# Patient Record
Sex: Female | Born: 1995 | Race: Black or African American | Hispanic: No | Marital: Single | Smoking: Never smoker
Health system: Southern US, Community
[De-identification: ages and names within clinical notes are randomized; demographics above are authoritative.]

## PROBLEM LIST (undated history)

## (undated) DIAGNOSIS — B9689 Other specified bacterial agents as the cause of diseases classified elsewhere: Secondary | ICD-10-CM

## (undated) DIAGNOSIS — N760 Acute vaginitis: Secondary | ICD-10-CM

## (undated) HISTORY — DX: Other specified bacterial agents as the cause of diseases classified elsewhere: N76.0

## (undated) HISTORY — PX: TONSILLECTOMY: SUR1361

## (undated) HISTORY — DX: Other specified bacterial agents as the cause of diseases classified elsewhere: B96.89

## (undated) HISTORY — PX: TOOTH EXTRACTION: SUR596

---
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
## 2017-08-29 HISTORY — PX: THERAPEUTIC ABORTION: SHX798

## 2018-02-19 ENCOUNTER — Encounter: Payer: Self-pay | Admitting: Obstetrics and Gynecology

## 2018-02-20 ENCOUNTER — Ambulatory Visit (INDEPENDENT_AMBULATORY_CARE_PROVIDER_SITE_OTHER): Payer: BLUE CROSS/BLUE SHIELD | Admitting: Obstetrics and Gynecology

## 2018-02-20 ENCOUNTER — Encounter: Payer: Self-pay | Admitting: Obstetrics and Gynecology

## 2018-02-20 VITALS — BP 112/70 | HR 85 | Ht 60.0 in | Wt 134.0 lb

## 2018-02-20 DIAGNOSIS — Z3041 Encounter for surveillance of contraceptive pills: Secondary | ICD-10-CM

## 2018-02-20 DIAGNOSIS — Z113 Encounter for screening for infections with a predominantly sexual mode of transmission: Secondary | ICD-10-CM | POA: Diagnosis not present

## 2018-02-20 DIAGNOSIS — Z01419 Encounter for gynecological examination (general) (routine) without abnormal findings: Secondary | ICD-10-CM

## 2018-02-20 MED ORDER — NORGESTIM-ETH ESTRAD TRIPHASIC 0.18/0.215/0.25 MG-25 MCG PO TABS: 1.0000 | ORAL_TABLET | Freq: Every day | ORAL | 3 refills | Status: DC

## 2018-02-20 NOTE — Progress Notes (Addendum)
PCP:  Patient, No Pcp Per   Chief Complaint  Patient presents with  . Gynecologic Exam     HPI:      [name] is a 22 y.o. No obstetric history on file. who LMP was Patient's last menstrual period was 02/08/2018 (approximate)., presents today for her NP annual examination.  Her menses are regular every 28-30 days, lasting 4 days.  Dysmenorrhea mild, occurring premenstrually. She does not have intermenstrual bleeding.  Sex activity: single partner, contraception - OCP (estrogen/progesterone) and condoms sometimes.  Last Pap: N/A Hx of STDs: none  There is no FH of breast cancer. There is no FH of ovarian cancer. The patient does do self-breast exams.  Tobacco use: The patient denies current or previous tobacco use. Alcohol use: social drinker No drug use.  Exercise: moderately active  She does not get adequate calcium and Vitamin D in her diet.  Gardasil declined   Past Medical History:  Diagnosis Date  . BV (bacterial vaginosis)     Past Surgical History:  Procedure Laterality Date  . TONSILLECTOMY    . TOOTH EXTRACTION      History reviewed. No pertinent family history.  Social History   Socioeconomic History  . Marital status: Single    Spouse name: Not on file  . Number of children: Not on file  . Years of education: Not on file  . Highest education level: Not on file  Occupational History  . Not on file  Social Needs  . Financial resource strain: Not on file  . Food insecurity:    Worry: Not on file    Inability: Not on file  . Transportation needs:    Medical: Not on file    Non-medical: Not on file  Tobacco Use  . Smoking status: Never Smoker  . Smokeless tobacco: Never Used  Substance and Sexual Activity  . Alcohol use: Not Currently  . Drug use: Never  . Sexual activity: Yes    Birth control/protection: Pill  Lifestyle  . Physical activity:    Days per week: Not on file    Minutes per session: Not on file  . Stress: Not on  file  Relationships  . Social connections:    Talks on phone: Not on file    Gets together: Not on file    Attends religious service: Not on file    Active member of club or organization: Not on file    Attends meetings of clubs or organizations: Not on file    Relationship status: Not on file  . Intimate partner violence:    Fear of current or ex partner: Not on file    Emotionally abused: Not on file    Physically abused: Not on file    Forced sexual activity: Not on file  Other Topics Concern  . Not on file  Social History Narrative  . Not on file    Outpatient Medications Prior to Visit  Medication Sig Dispense Refill  . Norgestimate-Ethinyl Estradiol Triphasic (TRI-LO-MARZIA) 0.18/0.215/0.25 MG-25 MCG tab TAKE 1 TABLET BY MOUTH EVERY DAY     No facility-administered medications prior to visit.       ROS:  Review of Systems  Constitutional: Negative for fatigue, fever and unexpected weight change.  Respiratory: Negative for cough, shortness of breath and wheezing.   Cardiovascular: Negative for chest pain, palpitations and leg swelling.  Gastrointestinal: Negative for blood in stool, constipation, diarrhea, nausea and vomiting.  Endocrine: Negative for cold intolerance,  heat intolerance and polyuria.  Genitourinary: Negative for dyspareunia, dysuria, flank pain, frequency, genital sores, hematuria, menstrual problem, pelvic pain, urgency, vaginal bleeding, vaginal discharge and vaginal pain.  Musculoskeletal: Negative for back pain, joint swelling and myalgias.  Skin: Negative for rash.  Neurological: Negative for dizziness, syncope, light-headedness, numbness and headaches.  Hematological: Negative for adenopathy.  Psychiatric/Behavioral: Negative for agitation, confusion, sleep disturbance and suicidal ideas. The patient is not nervous/anxious.   BREAST: No symptoms   Objective: BP 112/70   Pulse 85   Ht 5' (1.524 m)   Wt 134 lb (60.8 kg)   LMP 02/08/2018  (Approximate)   BMI 26.17 kg/m    Physical Exam  Constitutional: She is oriented to person, place, and time. She appears well-developed and well-nourished.  Genitourinary: Vagina normal and uterus normal. There is no rash or tenderness on the right labia. There is no rash or tenderness on the left labia. No erythema or tenderness in the vagina. No vaginal discharge found. Right adnexum does not display mass and does not display tenderness. Left adnexum does not display mass and does not display tenderness. Cervix does not exhibit motion tenderness or polyp. Uterus is not enlarged or tender.  Neck: Normal range of motion. No thyromegaly present.  Cardiovascular: Normal rate, regular rhythm and normal heart sounds.  No murmur heard. Pulmonary/Chest: Effort normal and breath sounds normal. Right breast exhibits no mass, no nipple discharge, no skin change and no tenderness. Left breast exhibits no mass, no nipple discharge, no skin change and no tenderness.  Abdominal: Soft. There is no tenderness. There is no guarding.  Musculoskeletal: Normal range of motion.  Neurological: She is alert and oriented to person, place, and time. No cranial nerve deficit.  Psychiatric: She has a normal mood and affect. Her behavior is normal.  Vitals reviewed.   Assessment/Plan: Encounter for annual routine gynecological examination  Screening for STD (sexually transmitted disease) - Plan: Chlamydia/Gonococcus/Trichomonas, NAA  Encounter for surveillance of contraceptive pills - OCP RF - Plan: Norgestimate-Ethinyl Estradiol Triphasic (TRI-LO-MARZIA) 0.18/0.215/0.25 MG-25 MCG tab  Meds ordered this encounter  Medications  . Norgestimate-Ethinyl Estradiol Triphasic (TRI-LO-MARZIA) 0.18/0.215/0.25 MG-25 MCG tab    Sig: Take 1 tablet by mouth daily.    Dispense:  3 Package    Refill:  3    Order Specific Question:   Supervising Provider    Answer:   Nadara MustardHARRIS, ROBERT P [[id number]]             GYN counsel breast  self exam, STD prevention, adequate intake of calcium and vitamin D, diet and exercise     F/U  Return in about 1 year (around 02/21/2019).  Alicia B. Copland, PA-C 02/20/2018 10:33 AM

## 2018-02-20 NOTE — Patient Instructions (Signed)
I value your feedback and entrusting us with your care. If you get a [facility] patient survey, I would appreciate you taking the time to let us know about your experience today. Thank you! 

## 2018-02-22 LAB — CHLAMYDIA/GONOCOCCUS/TRICHOMONAS, NAA
Chlamydia by NAA: NEGATIVE
Gonococcus by NAA: NEGATIVE
Trich vag by NAA: NEGATIVE

## 2019-03-14 ENCOUNTER — Other Ambulatory Visit: Payer: Self-pay | Admitting: *Deleted

## 2019-03-14 DIAGNOSIS — Z20822 Contact with and (suspected) exposure to covid-19: Secondary | ICD-10-CM

## 2019-03-19 ENCOUNTER — Other Ambulatory Visit: Payer: Self-pay | Admitting: Obstetrics and Gynecology

## 2019-03-19 DIAGNOSIS — Z3041 Encounter for surveillance of contraceptive pills: Secondary | ICD-10-CM

## 2019-03-21 LAB — NOVEL CORONAVIRUS, NAA: SARS-CoV-2, NAA: NOT DETECTED

## 2019-03-23 ENCOUNTER — Other Ambulatory Visit: Payer: Self-pay | Admitting: Obstetrics and Gynecology

## 2019-03-23 DIAGNOSIS — Z3041 Encounter for surveillance of contraceptive pills: Secondary | ICD-10-CM

## 2019-04-29 ENCOUNTER — Ambulatory Visit: Payer: BLUE CROSS/BLUE SHIELD | Admitting: Obstetrics and Gynecology

## 2019-05-02 ENCOUNTER — Other Ambulatory Visit: Payer: Self-pay

## 2019-05-02 ENCOUNTER — Encounter: Payer: Self-pay | Admitting: Obstetrics and Gynecology

## 2019-05-02 ENCOUNTER — Ambulatory Visit (INDEPENDENT_AMBULATORY_CARE_PROVIDER_SITE_OTHER): Payer: BLUE CROSS/BLUE SHIELD | Admitting: Obstetrics and Gynecology

## 2019-05-02 ENCOUNTER — Inpatient Hospital Stay (HOSPITAL_COMMUNITY): Admit: 2019-05-02 | Payer: BLUE CROSS/BLUE SHIELD

## 2019-05-02 VITALS — BP 110/80 | Ht 61.0 in | Wt 129.0 lb

## 2019-05-02 DIAGNOSIS — Z01419 Encounter for gynecological examination (general) (routine) without abnormal findings: Secondary | ICD-10-CM | POA: Diagnosis not present

## 2019-05-02 DIAGNOSIS — Z124 Encounter for screening for malignant neoplasm of cervix: Secondary | ICD-10-CM

## 2019-05-02 DIAGNOSIS — Z3041 Encounter for surveillance of contraceptive pills: Secondary | ICD-10-CM

## 2019-05-02 DIAGNOSIS — Z113 Encounter for screening for infections with a predominantly sexual mode of transmission: Secondary | ICD-10-CM

## 2019-05-02 MED ORDER — NORGESTIM-ETH ESTRAD TRIPHASIC 0.18/0.215/0.25 MG-25 MCG PO TABS: 1.0000 | ORAL_TABLET | Freq: Every day | ORAL | 3 refills | Status: DC

## 2019-05-02 NOTE — Patient Instructions (Signed)
I value your feedback and entrusting us with your care. If you get a [facility] patient survey, I would appreciate you taking the time to let us know about your experience today. Thank you! 

## 2019-05-02 NOTE — Progress Notes (Signed)
PCP:  Patient, No Pcp Per   Chief Complaint  Patient presents with  . Gynecologic Exam     HPI:      [name] is a 23 y.o. No obstetric history on file. who LMP was Patient's last menstrual period was 04/18/2019 (exact date)., presents today for her annual examination.  Her menses are regular every 28-30 days, lasting 4-5 days.  Dysmenorrhea none. She does not have intermenstrual bleeding.  Sex activity: single partner, contraception - OCP (estrogen/progesterone) and condoms sometimes. S/p EAB last yr after lost insurance and had to stop pills.   Last Pap: N/A in past; due this yr Hx of STDs: none  There is no FH of breast cancer. There is no FH of ovarian cancer. The patient does do self-breast exams.  Tobacco use: The patient denies current or previous tobacco use. Alcohol use: social drinker No drug use.  Exercise: moderately active  She does get adequate calcium and Vitamin D in her diet.  Gardasil declined again this yr   Past Medical History:  Diagnosis Date  . BV (bacterial vaginosis)     Past Surgical History:  Procedure Laterality Date  . THERAPEUTIC ABORTION  2019  . TONSILLECTOMY    . TOOTH EXTRACTION      Family History  Problem Relation Age of Onset  . Diabetes Mother     Social History   Socioeconomic History  . Marital status: Single    Spouse name: Not on file  . Number of children: Not on file  . Years of education: Not on file  . Highest education level: Not on file  Occupational History  . Not on file  Social Needs  . Financial resource strain: Not on file  . Food insecurity    Worry: Not on file    Inability: Not on file  . Transportation needs    Medical: Not on file    Non-medical: Not on file  Tobacco Use  . Smoking status: Never Smoker  . Smokeless tobacco: Never Used  Substance and Sexual Activity  . Alcohol use: Not Currently  . Drug use: Never  . Sexual activity: Yes    Birth control/protection: Pill   Lifestyle  . Physical activity    Days per week: Not on file    Minutes per session: Not on file  . Stress: Not on file  Relationships  . Social Musicianconnections    Talks on phone: Not on file    Gets together: Not on file    Attends religious service: Not on file    Active member of club or organization: Not on file    Attends meetings of clubs or organizations: Not on file    Relationship status: Not on file  . Intimate partner violence    Fear of current or ex partner: Not on file    Emotionally abused: Not on file    Physically abused: Not on file    Forced sexual activity: Not on file  Other Topics Concern  . Not on file  Social History Narrative  . Not on file    Outpatient Medications Prior to Visit  Medication Sig Dispense Refill  . TRI-LO-MARZIA 0.18/0.215/0.25 MG-25 MCG tab TAKE 1 TABLET BY MOUTH EVERY DAY 84 tablet 0   No facility-administered medications prior to visit.       ROS:  Review of Systems  Constitutional: Negative for fatigue, fever and unexpected weight change.  Respiratory: Negative for cough, shortness of breath  and wheezing.   Cardiovascular: Negative for chest pain, palpitations and leg swelling.  Gastrointestinal: Negative for blood in stool, constipation, diarrhea, nausea and vomiting.  Endocrine: Negative for cold intolerance, heat intolerance and polyuria.  Genitourinary: Negative for dyspareunia, dysuria, flank pain, frequency, genital sores, hematuria, menstrual problem, pelvic pain, urgency, vaginal bleeding, vaginal discharge and vaginal pain.  Musculoskeletal: Negative for back pain, joint swelling and myalgias.  Skin: Negative for rash.  Neurological: Negative for dizziness, syncope, light-headedness, numbness and headaches.  Hematological: Negative for adenopathy.  Psychiatric/Behavioral: Negative for agitation, confusion, sleep disturbance and suicidal ideas. The patient is not nervous/anxious.   BREAST: No symptoms   Objective:  BP 110/80   Ht 5\' 1"  (1.549 m)   Wt 129 lb (58.5 kg)   LMP 04/18/2019 (Exact Date)   BMI 24.37 kg/m    Physical Exam Constitutional:      Appearance: She is well-developed.  Genitourinary:     Vulva, vagina, uterus, right adnexa and left adnexa normal.     No vulval lesion or tenderness noted.     No vaginal discharge, erythema or tenderness.     No cervical motion tenderness or polyp.     Uterus is not enlarged or tender.     No right or left adnexal mass present.     Right adnexa not tender.     Left adnexa not tender.  Neck:     Musculoskeletal: Normal range of motion.     Thyroid: No thyromegaly.  Cardiovascular:     Rate and Rhythm: Normal rate and regular rhythm.     Heart sounds: Normal heart sounds. No murmur.  Pulmonary:     Effort: Pulmonary effort is normal.     Breath sounds: Normal breath sounds.  Chest:     Breasts:        Right: No mass, nipple discharge, skin change or tenderness.        Left: No mass, nipple discharge, skin change or tenderness.  Abdominal:     Palpations: Abdomen is soft.     Tenderness: There is no abdominal tenderness. There is no guarding.  Musculoskeletal: Normal range of motion.  Neurological:     General: No focal deficit present.     Mental Status: She is alert and oriented to person, place, and time.     Cranial Nerves: No cranial nerve deficit.  Skin:    General: Skin is warm and dry.  Psychiatric:        Mood and Affect: Mood normal.        Behavior: Behavior normal.        Thought Content: Thought content normal.        Judgment: Judgment normal.  Vitals signs reviewed.     Assessment/Plan: Encounter for annual routine gynecological examination  Cervical cancer screening - Plan: Cytology - PAP  Screening for STD (sexually transmitted disease) - Plan: Cytology - PAP  Encounter for surveillance of contraceptive pills - OCP RF - Plan: Norgestimate-Ethinyl Estradiol Triphasic (TRI-LO-MARZIA) 0.18/0.215/0.25 MG-25  MCG tab  Meds ordered this encounter  Medications  . Norgestimate-Ethinyl Estradiol Triphasic (TRI-LO-MARZIA) 0.18/0.215/0.25 MG-25 MCG tab    Sig: Take 1 tablet by mouth daily.    Dispense:  84 tablet    Refill:  3    Order Specific Question:   Supervising Provider    Answer:   Nadara Mustard [[id number]]             GYN counsel breast self exam, STD prevention,  adequate intake of calcium and vitamin D, diet and exercise     F/U  Return in about 1 year (around 05/01/2020).  Alicia B. Copland, PA-C 05/02/2019 11:13 AM

## 2019-05-07 LAB — CYTOLOGY - PAP
Chlamydia: NEGATIVE
Diagnosis: NEGATIVE
Neisseria Gonorrhea: NEGATIVE

## 2019-06-03 ENCOUNTER — Other Ambulatory Visit: Payer: Self-pay

## 2019-06-03 DIAGNOSIS — Z20822 Contact with and (suspected) exposure to covid-19: Secondary | ICD-10-CM

## 2019-06-05 LAB — NOVEL CORONAVIRUS, NAA: SARS-CoV-2, NAA: NOT DETECTED

## 2019-12-05 ENCOUNTER — Telehealth: Payer: Self-pay

## 2019-12-05 ENCOUNTER — Encounter: Payer: Self-pay | Admitting: Obstetrics and Gynecology

## 2019-12-05 NOTE — Telephone Encounter (Signed)
Stephanie w/[employer] calling in regards to PA for pt. [phone] opt 3 [id number]#[phone]

## 2019-12-05 NOTE — Telephone Encounter (Signed)
Pharmacy sent fax w/alternatives. Tri Lo Marzia not on formulary. Plan wants to try 3 formulary alternatives w/failure or intolerance prior to approval.

## 2019-12-05 NOTE — Telephone Encounter (Signed)
Patient called stating that she has COVID-19  She want to know when can she get the vaccine. Per CDC patient was told that she needs to wait until isolation is completed and her symptoms are gone.  It treated with convalescent plasm or monoclonial antibodies she must wait 90 days. She verbalized understanding of all information and has no other questions.

## 2019-12-09 ENCOUNTER — Other Ambulatory Visit: Payer: Self-pay | Admitting: Obstetrics and Gynecology

## 2019-12-09 MED ORDER — LEVONORGESTREL-ETHINYL ESTRAD 0.1-20 MG-MCG PO TABS: 1.0000 | ORAL_TABLET | Freq: Every day | ORAL | 1 refills | Status: DC

## 2019-12-09 NOTE — Telephone Encounter (Signed)
Rx aviane eRxd. On pt's formulary. Pls notify pt that Tri Lo Marzia not covered and has to do other OCPs first. Shouldn't have any problem. Start when next pill pack due. Can expect BTB the first pack or 2 since changing products, no lapse in [facility]. F/u prn.

## 2019-12-09 NOTE — Progress Notes (Signed)
OCP change to aviane due to ins formulary. This is covered alternative per pharmacy fax.

## 2019-12-09 NOTE — Telephone Encounter (Signed)
Pt aware.

## 2019-12-26 ENCOUNTER — Other Ambulatory Visit: Payer: Self-pay

## 2019-12-26 ENCOUNTER — Ambulatory Visit: Payer: 59 | Admitting: Nurse Practitioner

## 2019-12-26 ENCOUNTER — Encounter: Payer: Self-pay | Admitting: Nurse Practitioner

## 2019-12-26 VITALS — BP 98/58 | HR 90 | Temp 97.4°F | Ht 61.0 in | Wt 131.0 lb

## 2019-12-26 DIAGNOSIS — E049 Nontoxic goiter, unspecified: Secondary | ICD-10-CM

## 2019-12-26 DIAGNOSIS — Z0001 Encounter for general adult medical examination with abnormal findings: Secondary | ICD-10-CM

## 2019-12-26 DIAGNOSIS — Z0000 Encounter for general adult medical examination without abnormal findings: Secondary | ICD-10-CM

## 2019-12-26 DIAGNOSIS — F419 Anxiety disorder, unspecified: Secondary | ICD-10-CM

## 2019-12-26 HISTORY — DX: Encounter for general adult medical examination without abnormal findings: Z00.00

## 2019-12-26 LAB — LIPID PANEL
Cholesterol: 185 mg/dL (ref 0–200)
HDL: 62.4 mg/dL (ref >39.00)
LDL Cholesterol: 111 mg/dL — ABNORMAL HIGH (ref 0–99)
NonHDL: 123.05
Total CHOL/HDL Ratio: 3
Triglycerides: 61 mg/dL (ref 0.0–149.0)
VLDL: 12.2 mg/dL (ref 0.0–40.0)

## 2019-12-26 LAB — COMPREHENSIVE METABOLIC PANEL
ALT: 10 U/L (ref 0–35)
AST: 12 U/L (ref 0–37)
Albumin: 4.2 g/dL (ref 3.5–5.2)
Alkaline Phosphatase: 54 U/L (ref 39–117)
BUN: 7 mg/dL (ref 6–23)
CO2: 30 mEq/L (ref 19–32)
Calcium: 9.3 mg/dL (ref 8.4–10.5)
Chloride: 104 mEq/L (ref 96–112)
Creatinine, Ser: 0.75 mg/dL (ref 0.40–1.20)
GFR: 117.14 mL/min (ref >60.00)
Glucose, Bld: 99 mg/dL (ref 70–99)
Potassium: 4 mEq/L (ref 3.5–5.1)
Sodium: 138 mEq/L (ref 135–145)
Total Bilirubin: 0.2 mg/dL (ref 0.2–1.2)
Total Protein: 7.3 g/dL (ref 6.0–8.3)

## 2019-12-26 LAB — CBC WITH DIFFERENTIAL/PLATELET
Basophils Absolute: 0 10*3/uL (ref 0.0–0.1)
Basophils Relative: 1.2 % (ref 0.0–3.0)
Eosinophils Absolute: 0 10*3/uL (ref 0.0–0.7)
Eosinophils Relative: 0.7 % (ref 0.0–5.0)
HCT: 37.9 % (ref 36.0–46.0)
Hemoglobin: 12.5 g/dL (ref 12.0–15.0)
Lymphocytes Relative: 47.7 % — ABNORMAL HIGH (ref 12.0–46.0)
Lymphs Abs: 1 10*3/uL (ref 0.7–4.0)
MCHC: 32.9 g/dL (ref 30.0–36.0)
MCV: 83.9 fl (ref 78.0–100.0)
Monocytes Absolute: 0.4 10*3/uL (ref 0.1–1.0)
Monocytes Relative: 17.8 % — ABNORMAL HIGH (ref 3.0–12.0)
Neutro Abs: 0.7 10*3/uL — ABNORMAL LOW (ref 1.4–7.7)
Neutrophils Relative %: 32.6 % — ABNORMAL LOW (ref 43.0–77.0)
Platelets: 201 10*3/uL (ref 150.0–400.0)
RBC: 4.52 Mil/uL (ref 3.87–5.11)
RDW: 12.3 % (ref 11.5–15.5)
WBC: 2.1 10*3/uL — ABNORMAL LOW (ref 4.0–10.5)

## 2019-12-26 LAB — T3, FREE: T3, Free: 3.1 pg/mL (ref 2.3–4.2)

## 2019-12-26 LAB — T4, FREE: Free T4: 0.85 ng/dL (ref 0.60–1.60)

## 2019-12-26 LAB — VITAMIN D 25 HYDROXY (VIT D DEFICIENCY, FRACTURES): VITD: 23.84 ng/mL — ABNORMAL LOW (ref 30.00–100.00)

## 2019-12-26 NOTE — Patient Instructions (Addendum)
Wellness exam:   1. Diet and Exercise: I discussed the importance of a healthy lifestyle including appropriate food choices and regular exercise. I recommended a diet that is moderate in fiber with fresh fruits and vegetables and low in saturated fats and simple carbohydrates. I recommended getting exercise daily and vigorous exercise 3 times per week to include a total of at least 150 minutes per week.   2. Vaccines/Immunizations: The patient is up to date on age-appropriate screening exams and vaccines. 3.  Needs Tdap in the future- getting Covid vaccines now.   4. Chlamydia screen per GYN due 2021  5. Thyroid enlargement will check labs  6. Referral for anxiety to a counselor.     Preventive Care 4-32 Years Old, Female Preventive care refers to lifestyle choices and visits with your health care provider that can promote health and wellness. At this stage in your life, you may start seeing a primary care physician instead of a pediatrician. Your health care is now your responsibility. Preventive care for young adults includes:  A yearly physical exam. This is also called an annual wellness visit.  Regular dental and eye exams.  Immunizations.  Screening for certain conditions.  Healthy lifestyle choices, such as diet and exercise. What can I expect for my preventive care visit? Physical exam Your health care provider may check:  Height and weight. These may be used to calculate body mass index (BMI), which is a measurement that tells if you are at a healthy weight.  Heart rate and blood pressure.  Body temperature. Counseling Your health care provider may ask you questions about:  Past medical problems and family medical history.  Alcohol, tobacco, and drug use.  Home and relationship well-being.  Access to firearms.  Emotional well-being.  Diet, exercise, and sleep habits.  Sexual activity and sexual health.  Method of birth control.  Menstrual  cycle.  Pregnancy history. What immunizations do I need?  Influenza (flu) vaccine  This is recommended every year. Tetanus, diphtheria, and pertussis (Tdap) vaccine  You may need a Td booster every 10 years. Varicella (chickenpox) vaccine  You may need this vaccine if you have not already been vaccinated. Human papillomavirus (HPV) vaccine  If recommended by your health care provider, you may need three doses over 6 months. Measles, mumps, and rubella (MMR) vaccine  You may need at least one dose of MMR. You may also need a second dose. Meningococcal conjugate (MenACWY) vaccine  One dose is recommended if you are 31-21 years old and a Market researcher living in a residence hall, or if you have one of several medical conditions. You may also need additional booster doses. Pneumococcal conjugate (PCV13) vaccine  You may need this if you have certain conditions and were not previously vaccinated. Pneumococcal polysaccharide (PPSV23) vaccine  You may need one or two doses if you smoke cigarettes or if you have certain conditions. Hepatitis A vaccine  You may need this if you have certain conditions or if you travel or work in places where you may be exposed to hepatitis A. Hepatitis B vaccine  You may need this if you have certain conditions or if you travel or work in places where you may be exposed to hepatitis B. Haemophilus influenzae type b (Hib) vaccine  You may need this if you have certain risk factors. You may receive vaccines as individual doses or as more than one vaccine together in one shot (combination vaccines). Talk with your health care provider about  the risks and benefits of combination vaccines. What tests do I need? Blood tests  Lipid and cholesterol levels. These may be checked every 5 years starting at age 44.  Hepatitis C test.  Hepatitis B test. Screening  Pelvic exam and Pap test. This may be done every 3 years starting at age  13.  Sexually transmitted disease (STD) testing, if you are at risk.  BRCA-related cancer screening. This may be done if you have a family history of breast, ovarian, tubal, or peritoneal cancers. Other tests  Tuberculosis skin test.  Vision and hearing tests.  Skin exam.  Breast exam. Follow these instructions at home: Eating and drinking   Eat a diet that includes fresh fruits and vegetables, whole grains, lean protein, and low-fat dairy products.  Drink enough fluid to keep your urine pale yellow.  Do not drink alcohol if: ? Your health care provider tells you not to drink. ? You are pregnant, may be pregnant, or are planning to become pregnant. ? You are under the legal drinking age. In the [place] the legal drinking age is 29.  If you drink alcohol: ? Limit how much you have to 0-1 drink a day. ? Be aware of how much alcohol is in your drink. In the [place] one drink equals one 12 oz bottle of beer (355 mL), one 5 oz glass of wine (148 mL), or one 1 oz glass of hard liquor (44 mL). Lifestyle  Take daily care of your teeth and gums.  Stay active. Exercise at least 30 minutes 5 or more days of the week.  Do not use any products that contain nicotine or tobacco, such as cigarettes, e-cigarettes, and chewing tobacco. If you need help quitting, ask your health care provider.  Do not use drugs.  If you are sexually active, practice safe sex. Use a condom or other form of birth control (contraception) in order to prevent pregnancy and STIs (sexually transmitted infections). If you plan to become pregnant, see your health care provider for a pre-conception visit.  Find healthy ways to cope with stress, such as: ? Meditation, yoga, or listening to music. ? Journaling. ? Talking to a trusted person. ? Spending time with friends and family. Safety  Always wear your seat belt while driving or riding in a vehicle.  Do not drive if you have been drinking alcohol. Do not ride  with someone who has been drinking.  Do not drive when you are tired or distracted. Do not text while driving.  Wear a helmet and other protective equipment during sports activities.  If you have firearms in your house, make sure you follow all gun safety procedures.  Seek help if you have been bullied, physically abused, or sexually abused.  Use the Internet responsibly to avoid dangers such as online bullying and online sex predators. What's next?  Go to your health care provider once a year for a well check visit.  Ask your health care provider how often you should have your eyes and teeth checked.  Stay up to date on all vaccines. This information is not intended to replace advice given to you by your health care provider. Make sure you discuss any questions you have with your health care provider. Document Revised: 08/09/2018 Document Reviewed: 08/09/2018 Elsevier Patient Education  [place], Adult After being diagnosed with an anxiety disorder, you may be relieved to know why you have felt or behaved a certain way. You may also feel  overwhelmed about the treatment ahead and what it will mean for your life. With care and support, you can manage this condition and recover from it. How to manage lifestyle changes Managing stress and anxiety  Stress is your body's reaction to life changes and events, both good and bad. Most stress will last just a few hours, but stress can be ongoing and can lead to more than just stress. Although stress can play a major role in anxiety, it is not the same as anxiety. Stress is usually caused by something external, such as a deadline, test, or competition. Stress normally passes after the triggering event has ended.  Anxiety is caused by something internal, such as imagining a terrible outcome or worrying that something will go wrong that will devastate you. Anxiety often does not go away even after the triggering event is  over, and it can become long-term (chronic) worry. It is important to understand the differences between stress and anxiety and to manage your stress effectively so that it does not lead to an anxious response. Talk with your health care provider or a counselor to learn more about reducing anxiety and stress. He or she may suggest tension reduction techniques, such as:  Music therapy. This can include creating or listening to music that you enjoy and that inspires you.  Mindfulness-based meditation. This involves being aware of your normal breaths while not trying to control your breathing. It can be done while sitting or walking.  Centering prayer. This involves focusing on a word, phrase, or sacred image that means something to you and brings you peace.  Deep breathing. To do this, expand your stomach and inhale slowly through your nose. Hold your breath for 3-5 seconds. Then exhale slowly, letting your stomach muscles relax.  Self-talk. This involves identifying thought patterns that lead to anxiety reactions and changing those patterns.  Muscle relaxation. This involves tensing muscles and then relaxing them. Choose a tension reduction technique that suits your lifestyle and personality. These techniques take time and practice. Set aside 5-15 minutes a day to do them. Therapists can offer counseling and training in these techniques. The training to help with anxiety may be covered by some insurance plans. Other things you can do to manage stress and anxiety include:  Keeping a stress/anxiety diary. This can help you learn what triggers your reaction and then learn ways to manage your response.  Thinking about how you react to certain situations. You may not be able to control everything, but you can control your response.  Making time for activities that help you relax and not feeling guilty about spending your time in this way.  Visual imagery and yoga can help you stay calm and  relax.  Medicines Medicines can help ease symptoms. Medicines for anxiety include:  Anti-anxiety drugs.  Antidepressants. Medicines are often used as a primary treatment for anxiety disorder. Medicines will be prescribed by a health care provider. When used together, medicines, psychotherapy, and tension reduction techniques may be the most effective treatment. Relationships Relationships can play a big part in helping you recover. Try to spend more time connecting with trusted friends and family members. Consider going to couples counseling, taking family education classes, or going to family therapy. Therapy can help you and others better understand your condition. How to recognize changes in your anxiety Everyone responds differently to treatment for anxiety. Recovery from anxiety happens when symptoms decrease and stop interfering with your daily activities at home or work. This may  mean that you will start to:  Have better concentration and focus. Worry will interfere less in your daily thinking.  Sleep better.  Be less irritable.  Have more energy.  Have improved memory. It is important to recognize when your condition is getting worse. Contact your health care provider if your symptoms interfere with home or work and you feel like your condition is not improving. Follow these instructions at home: Activity  Exercise. Most adults should do the following: ? Exercise for at least 150 minutes each week. The exercise should increase your heart rate and make you sweat (moderate-intensity exercise). ? Strengthening exercises at least twice a week.  Get the right amount and quality of sleep. Most adults need 7-9 hours of sleep each night. Lifestyle   Eat a healthy diet that includes plenty of vegetables, fruits, whole grains, low-fat dairy products, and lean protein. Do not eat a lot of foods that are high in solid fats, added sugars, or salt.  Make choices that simplify your  life.  Do not use any products that contain nicotine or tobacco, such as cigarettes, e-cigarettes, and chewing tobacco. If you need help quitting, ask your health care provider.  Avoid caffeine, alcohol, and certain over-the-counter cold medicines. These may make you feel worse. Ask your pharmacist which medicines to avoid. General instructions  Take over-the-counter and prescription medicines only as told by your health care provider.  Keep all follow-up visits as told by your health care provider. This is important. Where to find support You can get help and support from these sources:  Self-help groups.  Online and [employer].  A trusted spiritual leader.  Couples counseling.  Family education classes.  Family therapy. Where to find more information You may find that joining a support group helps you deal with your anxiety. The following sources can help you locate counselors or support groups near you:  [place]: www.mentalhealthamer[place].net  Anxiety and Depression Association of [place] (ADAA): https://www.clark.net/  National Alliance on Mental Illness (NAMI): www.nami.org Contact a health care provider if you:  Have a hard time staying focused or finishing daily tasks.  Spend many hours a day feeling worried about everyday life.  Become exhausted by worry.  Start to have headaches, feel tense, or have nausea.  Urinate more than normal.  Have diarrhea. Get help right away if you have:  A racing heart and shortness of breath.  Thoughts of hurting yourself or others. If you ever feel like you may hurt yourself or others, or have thoughts about taking your own life, get help right away. You can go to your nearest emergency department or call:  Your local emergency services (911 in the [place]  A suicide crisis helpline, such as the [facility] at 972 305 2911. This is open 24 hours a day. Summary  Taking steps to  learn and use tension reduction techniques can help calm you and help prevent triggering an anxiety reaction.  When used together, medicines, psychotherapy, and tension reduction techniques may be the most effective treatment.  Family, friends, and partners can play a big part in helping you recover from an anxiety disorder. This information is not intended to replace advice given to you by your health care provider. Make sure you discuss any questions you have with your health care provider. Document Revised: 01/15/2019 Document Reviewed: 01/15/2019 Elsevier Patient Education  [place].

## 2019-12-26 NOTE — Progress Notes (Signed)
New Patient Office Visit  Subjective:  Patient ID: Connie Stein, female    DOB: 22-Apr-1996  Age: 24 y.o. MRN: [id number]  CC:  Chief Complaint  Patient presents with  . Establish Care    HPI Connie Stein presents for comes in to establish care. She was told x2 BP was high when she sprained her foot and again when she got the Covid test.  She is followed for birth control at GYN. LMP -current 12/25/19.  Anxiety: Started the end of 2019 before Covid. Then, she had to leave college and come home where her stress escalated over 2020. She states that she worries about everything. Some nights she cannot fall asleep for hours. Other nights, she goes to bed at 2100 and wakes up at 0440. She is very busy with a job at [employer], school and a side job babysitting. 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 She says she does not want any medication for her anxiety.  She did speak to the therapist in the past and did not find that very helpful.  Her scores today on PHQ-4 and she does not feel depressed or sad.  No history of depression, self-harm. Her anxiety score is higher.  GAD7- 10.   Past Medical History:  Diagnosis Date  . BV (bacterial vaginosis)   . Healthy adult on routine physical examination 12/26/2019    Past Surgical History:  Procedure Laterality Date  . THERAPEUTIC ABORTION  2019  . TONSILLECTOMY    . TOOTH EXTRACTION      Family History  Problem Relation Age of Onset  . Diabetes Mother     Social History   Socioeconomic History  . Marital status: Single    Spouse name: Not on file  . Number of children: Not on file  . Years of education: Not on file  . Highest education level: Not on file  Occupational History  . Not on file  Tobacco Use  . Smoking status: Never Smoker  . Smokeless tobacco: Never Used  Substance and Sexual Activity  . Alcohol use: Yes    Comment:  On occasion  . Drug use: Never  . Sexual activity: Yes    Birth control/protection: Pill  Other Topics Concern  . Not on file  Social History Narrative  . Not on file   Social Determinants of Health   Financial Resource Strain:   . Difficulty of Paying Living Expenses:   Food Insecurity:   . Worried About Charity fundraiser in the Last Year:   . Arboriculturist in the Last Year:   Transportation Needs:   . Film/video editor (Medical):   Marland Kitchen Lack of Transportation (Non-Medical):   Physical Activity:   . Days of Exercise per Week:   . Minutes of Exercise per Session:   Stress:   . Feeling of Stress :   Social Connections:   . Frequency of Communication with Friends and Family:   . Frequency of Social Gatherings with Friends and Family:   . Attends Religious Services:   . Active Member of Clubs or Organizations:   . Attends Archivist Meetings:   Marland Kitchen Marital Status:   Intimate Partner Violence:   . Fear of Current or Ex-Partner:   . Emotionally Abused:   Marland Kitchen Physically Abused:   . Sexually Abused:      Review of  Systems  Constitutional: Negative for chills and fever.  HENT: Negative for congestion and sore throat.   Eyes: Negative.   Respiratory: Negative for cough and shortness of breath.   Cardiovascular: Negative for chest pain.  Gastrointestinal: Negative for abdominal pain and constipation.  Endocrine: Negative.   Genitourinary: Negative for dysuria and frequency.  Musculoskeletal: Negative for arthralgias.  Skin: Negative.  Negative for color change.  Allergic/Immunologic: Negative.   Neurological: Negative.   Hematological: Negative.   Psychiatric/Behavioral:       Positive anxiety- see HPI    Objective:   Today's Vitals: BP (!) 98/58   Pulse 90   Temp (!) 97.4 F (36.3 C) (Temporal)   Ht 5\' 1"  (1.549 m)   Wt 131 lb (59.4 kg)   LMP 12/24/2019 (Exact Date)   SpO2 97%   BMI 24.75 kg/m   Physical Exam Constitutional:      Appearance:  Normal appearance. She is normal weight.  HENT:     Head: Normocephalic and atraumatic.     Right Ear: Tympanic membrane normal.     Left Ear: Tympanic membrane normal.  Eyes:     Conjunctiva/sclera: Conjunctivae normal.     Pupils: Pupils are equal, round, and reactive to light.  Neck:     Comments: Very enlarged swelling bilateral at base of neck to consider goiter. Non-tender. Patient has not noticed this swelling.   Cardiovascular:     Rate and Rhythm: Normal rate and regular rhythm.     Pulses: Normal pulses.     Heart sounds: Normal heart sounds.  Pulmonary:     Effort: Pulmonary effort is normal.     Breath sounds: Normal breath sounds.  Abdominal:     General: Abdomen is flat.     Palpations: Abdomen is soft.     Tenderness: There is no abdominal tenderness.  Musculoskeletal:        General: Normal range of motion.     Cervical back: Normal range of motion and neck supple.  Skin:    General: Skin is warm and dry.  Neurological:     General: No focal deficit present.     Mental Status: She is alert and oriented to person, place, and time.  Psychiatric:        Mood and Affect: Mood normal.        Behavior: Behavior normal.        Thought Content: Thought content normal.        Judgment: Judgment normal.     Comments: Positive anxiety-see HPI.       Assessment & Plan:   Problem List Items Addressed This Visit      Other   Healthy adult on routine physical examination - Primary   Relevant Orders   CBC with Differential/Platelet (Completed)   Comprehensive metabolic panel (Completed)   Lipid panel (Completed)   VITAMIN D 25 Hydroxy (Vit-D Deficiency, Fractures) (Completed)    Other Visit Diagnoses    Anxiety       Relevant Orders   Ambulatory referral to Psychology   Thyroid enlargement       Relevant Orders   T4, free (Completed)   T3, free (Completed)      Outpatient Encounter Medications as of 12/26/2019  Medication Sig  . levonorgestrel-ethinyl  estradiol (AVIANE) 0.1-20 MG-MCG tablet Take 1 tablet by mouth daily.   No facility-administered encounter medications on file as of 12/26/2019.   1. Diet and Exercise: I discussed the importance of a healthy  lifestyle including appropriate food choices and regular exercise. I recommended a diet that is moderate in fiber with fresh fruits and vegetables and low in saturated fats and simple carbohydrates. I recommended getting exercise daily and vigorous exercise 3 times per week to include a total of at least 150 minutes per week.   2. Vaccines/Immunizations: The patient is up to date on age-appropriate screening exams and vaccines. 3.  Needs Tdap in the future- getting Covid vaccines now.   4. Chlamydia screen per GYN due 2021  5. Thyroid enlargement will check labs. She will need [place] of neck- will await thyroid results and decide on referral to ENT or ENDOCRINOLOGY  6. Referral for anxiety to a counselor.    Follow-up: Return in about 2 months (around 02/25/2020).   This visit occurred during the SARS-CoV-2 public health emergency.  Safety protocols were in place, including screening questions prior to the visit, additional usage of staff PPE, and extensive cleaning of exam room while observing appropriate contact time as indicated for disinfecting solutions.   Amedeo Kinsman, NP

## 2019-12-27 ENCOUNTER — Telehealth: Payer: Self-pay | Admitting: Nurse Practitioner

## 2019-12-27 DIAGNOSIS — E049 Nontoxic goiter, unspecified: Secondary | ICD-10-CM

## 2019-12-27 DIAGNOSIS — D709 Neutropenia, unspecified: Secondary | ICD-10-CM

## 2019-12-27 NOTE — Telephone Encounter (Signed)
Can you add on a TSH from yest? <48 hours.

## 2019-12-27 NOTE — Telephone Encounter (Signed)
I spoke to [facility] about the CBC and she is in agreement to see Hematology. I will place an urgent referral.   I have requested an add on for TSH.

## 2019-12-27 NOTE — Telephone Encounter (Signed)
I called her to give lab results and discuss plan of care. I had to leave a MOM to call us back. Please get me to the phone when she returns the call.

## 2019-12-30 NOTE — Addendum Note (Signed)
Addended by: Warden Fillers on: 12/30/2019 10:29 AM   Modules accepted: Orders

## 2019-12-30 NOTE — Telephone Encounter (Signed)
TSH can not be added. Will need to be redrawn. Please send add on request to both of [place] in the lab to ensure these request are added in a timely manner.

## 2019-12-30 NOTE — Telephone Encounter (Signed)
Add on sheet faxed 

## 2019-12-31 NOTE — Telephone Encounter (Signed)
Patient scheduled for TSH lab on 01/14/2020 at 10:45am

## 2020-01-01 ENCOUNTER — Telehealth: Payer: Self-pay | Admitting: Nurse Practitioner

## 2020-01-01 DIAGNOSIS — E049 Nontoxic goiter, unspecified: Secondary | ICD-10-CM

## 2020-01-01 DIAGNOSIS — D709 Neutropenia, unspecified: Secondary | ICD-10-CM

## 2020-01-01 NOTE — Telephone Encounter (Signed)
Connie Stein: Please call her and advise that she must see Hematology irregardless of what the TSH shows. She has an abnormality in her CBC that needs further investigation. I would like her to be seen ASAP.   If she agrees, please forward to Rasheedah to re-submit the referral.

## 2020-01-01 NOTE — Telephone Encounter (Signed)
Message from 01/01/2020 from Hematology: Patient states wants to go to PCP first before make appt. MR

## 2020-01-02 NOTE — Telephone Encounter (Signed)
Can you please re-submit referral to hematology. Patient agreed to see them.

## 2020-01-02 NOTE — Telephone Encounter (Signed)
I called her Thurs and left HIPA compliant message.   Please call her and advise:   I would like [facility] to get a thyroid US now. We do not need to wait for the TSH. I will need it because of her neck swelling.  I have added blood work- to her 5/18/ visit after collaboration with Dr. Lorin Picket.   Please keep  f/up with me arranged.

## 2020-01-03 ENCOUNTER — Telehealth: Payer: Self-pay | Admitting: Nurse Practitioner

## 2020-01-03 NOTE — Telephone Encounter (Signed)
Left vm for pt to call ofc to sch US thyroid.

## 2020-01-03 NOTE — Telephone Encounter (Signed)
Rasheedah called patient to schedule [place]; waiting on patient to call back.

## 2020-01-09 NOTE — Telephone Encounter (Signed)
I spoke with pt she is scheduled to have [place]

## 2020-01-13 ENCOUNTER — Ambulatory Visit
Admission: RE | Admit: 2020-01-13 | Discharge: 2020-01-13 | Disposition: A | Discharge status: Home / Self Care | Payer: 59 | Source: Ambulatory Visit | Attending: Nurse Practitioner | Admitting: Nurse Practitioner

## 2020-01-13 ENCOUNTER — Other Ambulatory Visit: Payer: Self-pay

## 2020-01-13 DIAGNOSIS — E049 Nontoxic goiter, unspecified: Secondary | ICD-10-CM | POA: Insufficient documentation

## 2020-01-14 ENCOUNTER — Other Ambulatory Visit (INDEPENDENT_AMBULATORY_CARE_PROVIDER_SITE_OTHER): Payer: 59

## 2020-01-14 ENCOUNTER — Other Ambulatory Visit: Payer: Self-pay

## 2020-01-14 ENCOUNTER — Telehealth: Payer: Self-pay | Admitting: Nurse Practitioner

## 2020-01-14 DIAGNOSIS — R221 Localized swelling, mass and lump, neck: Secondary | ICD-10-CM

## 2020-01-14 DIAGNOSIS — D709 Neutropenia, unspecified: Secondary | ICD-10-CM | POA: Diagnosis not present

## 2020-01-14 DIAGNOSIS — E049 Nontoxic goiter, unspecified: Secondary | ICD-10-CM | POA: Diagnosis not present

## 2020-01-14 LAB — CBC WITH DIFFERENTIAL/PLATELET
Basophils Absolute: 0 10*3/uL (ref 0.0–0.1)
Basophils Relative: 0.8 % (ref 0.0–3.0)
Eosinophils Absolute: 0 10*3/uL (ref 0.0–0.7)
Eosinophils Relative: 1.1 % (ref 0.0–5.0)
HCT: 37.5 % (ref 36.0–46.0)
Hemoglobin: 12.4 g/dL (ref 12.0–15.0)
Lymphocytes Relative: 50.2 % — ABNORMAL HIGH (ref 12.0–46.0)
Lymphs Abs: 1.4 10*3/uL (ref 0.7–4.0)
MCHC: 33.1 g/dL (ref 30.0–36.0)
MCV: 84.1 fl (ref 78.0–100.0)
Monocytes Absolute: 0.3 10*3/uL (ref 0.1–1.0)
Monocytes Relative: 9.8 % (ref 3.0–12.0)
Neutro Abs: 1.1 10*3/uL — ABNORMAL LOW (ref 1.4–7.7)
Neutrophils Relative %: 38.1 % — ABNORMAL LOW (ref 43.0–77.0)
Platelets: 232 10*3/uL (ref 150.0–400.0)
RBC: 4.46 Mil/uL (ref 3.87–5.11)
RDW: 12.6 % (ref 11.5–15.5)
WBC: 2.8 10*3/uL — ABNORMAL LOW (ref 4.0–10.5)

## 2020-01-14 LAB — TSH: TSH: 1.1 u[IU]/mL (ref 0.35–4.50)

## 2020-01-14 LAB — B12 AND FOLATE PANEL
Folate: 11.7 ng/mL (ref >5.9)
Vitamin B-12: 210 pg/mL — ABNORMAL LOW (ref 211–911)

## 2020-01-14 NOTE — Addendum Note (Signed)
Addended by: Warden Fillers on: 01/14/2020 11:27 AM   Modules accepted: Orders

## 2020-01-14 NOTE — Telephone Encounter (Addendum)
Connie Stein came in for blood work today. Her thyroid labs and thyroid US returned normal. She has neck swelling. Dr. Lorin Picket examined her neck and notes symmetric swelling in the neck and the left is a little more full. No discrete nodule. The patient has noticed a slight swelling, but it is not bothersome to her. It is not tender, no sore throat or any difficulty swallowing.She is not obese.   PLAN:  1. ENT referral to advise regarding need for further imaging for neck swelling. Her only free date to see ENT is next Tues and she lives in [place] and hopes to get an appt in [place] or closer to home. If she can't get in next Tues- next date will be after June 15.   2. Her CBC remains abnormal and she needs to see Hematology. We have already placed a referral  For Dr. Smith Robert for neutropenia. The patient can only come after June 15. I resubmitted a new referral in for her [place] office- closer to [facility].

## 2020-01-15 ENCOUNTER — Other Ambulatory Visit: Payer: 59

## 2020-01-15 LAB — ACUTE HEP PANEL AND HEP B SURFACE AB
HEPATITIS C ANTIBODY REFILL$(REFL): NONREACTIVE
Hep A IgM: NONREACTIVE
Hep B C IgM: NONREACTIVE
Hepatitis B Surface Ag: NONREACTIVE
SIGNAL TO CUT-OFF: 0.01 (ref <1.00)

## 2020-01-15 LAB — REFLEX TIQ

## 2020-01-15 LAB — HIV ANTIBODY (ROUTINE TESTING W REFLEX): HIV 1&2 Ab, 4th Generation: NONREACTIVE

## 2020-01-15 NOTE — Telephone Encounter (Signed)
Referral to ENT was placed they will call pt to schedule once they review referral.

## 2020-01-15 NOTE — Telephone Encounter (Signed)
Patient aware of below message

## 2020-01-21 ENCOUNTER — Telehealth: Payer: Self-pay | Admitting: Nurse Practitioner

## 2020-01-21 NOTE — Telephone Encounter (Signed)
This was noted on 05/05 Patient states wants to go to PCP first before make appt. MR  Rejection Reason - Patient Declined" [facility] said 1 day ago

## 2020-01-21 NOTE — Telephone Encounter (Signed)
I saw he and she is in agreement to see HEMATOL now - but cannot go until 3rd week in June as on vacation,

## 2020-01-22 ENCOUNTER — Telehealth: Payer: Self-pay | Admitting: Nurse Practitioner

## 2020-01-22 ENCOUNTER — Ambulatory Visit (INDEPENDENT_AMBULATORY_CARE_PROVIDER_SITE_OTHER): Payer: 59 | Admitting: Psychology

## 2020-01-22 DIAGNOSIS — F411 Generalized anxiety disorder: Secondary | ICD-10-CM | POA: Diagnosis not present

## 2020-01-22 NOTE — Telephone Encounter (Signed)
Rejection Reason - Patient did not respond - pt was going to talk to PCP and call back but pt have not responded back" [facility] said about 2 hours ago

## 2020-01-24 NOTE — Telephone Encounter (Signed)
Ok. Thanks!

## 2020-02-02 ENCOUNTER — Telehealth: Payer: Self-pay | Admitting: Nurse Practitioner

## 2020-02-02 NOTE — Telephone Encounter (Signed)
Connie Stein has postponed her referrals until after graduation and her vacation -back June 3,2021.   I asked [name] to try to get those arranged for ENT-neck swelling and normal thyroid and to Hematology for neutropenia.  Plan:  Can you keep an eye out for this to get completed? Thank you.

## 2020-02-12 ENCOUNTER — Ambulatory Visit: Payer: 59 | Admitting: Psychology

## 2020-02-17 NOTE — Telephone Encounter (Signed)
Have you been able to reach out to patient about her referrals?

## 2020-02-20 NOTE — Telephone Encounter (Signed)
As of 01/16/2020 msg from ENT Kennon Rounds spoke w/pt today and pt stated she did not need our services. PM

## 2020-02-26 ENCOUNTER — Ambulatory Visit: Payer: 59 | Admitting: Nurse Practitioner

## 2020-03-06 ENCOUNTER — Encounter: Payer: Self-pay | Admitting: Nurse Practitioner

## 2020-03-06 ENCOUNTER — Ambulatory Visit (INDEPENDENT_AMBULATORY_CARE_PROVIDER_SITE_OTHER): Payer: 59 | Admitting: Nurse Practitioner

## 2020-03-06 ENCOUNTER — Other Ambulatory Visit: Payer: Self-pay

## 2020-03-06 VITALS — BP 100/58 | HR 80 | Temp 98.4°F | Ht 61.0 in | Wt 131.0 lb

## 2020-03-06 DIAGNOSIS — D709 Neutropenia, unspecified: Secondary | ICD-10-CM | POA: Insufficient documentation

## 2020-03-06 DIAGNOSIS — R7989 Other specified abnormal findings of blood chemistry: Secondary | ICD-10-CM | POA: Diagnosis not present

## 2020-03-06 DIAGNOSIS — E538 Deficiency of other specified B group vitamins: Secondary | ICD-10-CM

## 2020-03-06 DIAGNOSIS — R221 Localized swelling, mass and lump, neck: Secondary | ICD-10-CM | POA: Insufficient documentation

## 2020-03-06 HISTORY — DX: Neutropenia, unspecified: D70.9

## 2020-03-06 HISTORY — DX: Localized swelling, mass and lump, neck: R22.1

## 2020-03-06 NOTE — Progress Notes (Signed)
Established Patient Office Visit  Subjective:  Patient ID: Connie Stein, female    DOB: Dec 27, 1995  Age: 24 y.o. MRN: [id number]  CC:  Chief Complaint  Patient presents with  . Follow-up    HPI Connie GeorgeSakeah T Whisenant presents for follow-up of neck swelling with negative thyroid ultrasound and laboratory studies with referral to ENT incomplete, chronic stable neutropenia with referral to hematology incomplete.  She presents today feeling well without any complaints.  She was last seen in the office 12/26/2019 and has graduated from college, and returned from vacation.  She reports her anxiety is much improved.   Labs returning negative for HIV, hepatitis, thyroid. CBC still shows the lower than normal WBC  (2.1 and 2.8) and neutrophils( 32.6 and 38). B12 is 210- low- please take B12 oral 1000 mcg pill daily.  Low Vit D 23.8  She has declined hematology appointment when they called to make an appt.  Today, she reports she did not understand why she needs to be seen although we had discussion about neutropenia several times.  Patient is in agreement to see Hematology.  She has swelling at the base of the neck with unremarkable thyroid ultrasound and thyroid labs:  Normal free T3, free T4 and TSH.  She was referred to ENT and when they called to make her an appt, she told them that that she did not need their services.  We discussed again  the reason for ENT evaluation is for opinion on neck swelling that is not thyroid related.  Patient is agreement to see ENT.   Lab Results  Component Value Date   TSH 1.10 01/14/2020   CLINICAL DATA:  Thyroid enlargement  EXAM: THYROID ULTRASOUND  TECHNIQUE: Ultrasound examination of the thyroid gland and adjacent soft tissues was performed.  COMPARISON:  None.  FINDINGS: Parenchymal Echotexture: Normal  Isthmus: 2 mm  Right lobe: 4.3 x 1.4 x 1.7 cm  Left lobe: 4.3 x 1.2 x 1.3  cm  _________________________________________________________  Estimated total number of nodules >/= 1 cm: 0  Number of spongiform nodules >/=  2 cm not described below (TR1): 0  Number of mixed cystic and solid nodules >/= 1.5 cm not described below (TR2): 0  _________________________________________________________  No discrete nodules are seen within the thyroid gland.  IMPRESSION: Normal thyroid ultrasound for age  The above is in keeping with the ACR TI-RADS recommendations - J Am Coll Radiol 2017;14:587-595.   Electronically Signed   By: Judie PetitM.  Shick M.D.   On: 01/13/2020 14:40  Past Medical History:  Diagnosis Date  . BV (bacterial vaginosis)   . Healthy adult on routine physical examination 12/26/2019  . Neck swelling 03/06/2020  . Neutropenia (HCC) 03/06/2020    Past Surgical History:  Procedure Laterality Date  . THERAPEUTIC ABORTION  2019  . TONSILLECTOMY    . TOOTH EXTRACTION      Family History  Problem Relation Age of Onset  . Diabetes Mother     Social History   Socioeconomic History  . Marital status: Single    Spouse name: Not on file  . Number of children: Not on file  . Years of education: Not on file  . Highest education level: Not on file  Occupational History  . Not on file  Tobacco Use  . Smoking status: Never Smoker  . Smokeless tobacco: Never Used  Vaping Use  . Vaping Use: Never used  Substance and Sexual Activity  . Alcohol use: Yes  Comment: On occasion  . Drug use: Never  . Sexual activity: Yes    Birth control/protection: Pill  Other Topics Concern  . Not on file  Social History Narrative  . Not on file   Social Determinants of Health   Financial Resource Strain:   . Difficulty of Paying Living Expenses:   Food Insecurity:   . Worried About Programme researcher, broadcasting/film/video in the Last Year:   . Barista in the Last Year:   Transportation Needs:   . Freight forwarder (Medical):   Marland Kitchen Lack of Transportation  (Non-Medical):   Physical Activity:   . Days of Exercise per Week:   . Minutes of Exercise per Session:   Stress:   . Feeling of Stress :   Social Connections:   . Frequency of Communication with Friends and Family:   . Frequency of Social Gatherings with Friends and Family:   . Attends Religious Services:   . Active Member of Clubs or Organizations:   . Attends Banker Meetings:   Marland Kitchen Marital Status:   Intimate Partner Violence:   . Fear of Current or Ex-Partner:   . Emotionally Abused:   Marland Kitchen Physically Abused:   . Sexually Abused:     Outpatient Medications Prior to Visit  Medication Sig Dispense Refill  . levonorgestrel-ethinyl estradiol (AVIANE) 0.1-20 MG-MCG tablet Take 1 tablet by mouth daily. 84 tablet 1   No facility-administered medications prior to visit.    Allergies  Allergen Reactions  . Amoxicillin Hives and Other (See Comments)    Review of Systems  Constitutional: Negative for appetite change, chills, fever and unexpected weight change.  HENT: Negative.   Eyes: Negative.   Respiratory: Negative.   Cardiovascular: Negative.   Gastrointestinal: Negative.   Endocrine: Negative for cold intolerance, heat intolerance and polyuria.  Genitourinary: Negative.   Musculoskeletal: Negative.   Allergic/Immunologic: Negative.   Neurological: Negative.   Hematological: Negative for adenopathy. Does not bruise/bleed easily.      Objective:    Physical Exam Vitals reviewed.  Constitutional:      Appearance: Normal appearance. She is normal weight.  HENT:     Head: Normocephalic and atraumatic.  Eyes:     Pupils: Pupils are equal, round, and reactive to light.  Neck:     Comments: Same base of neck bilateral visible enlargement. Non- tender.  Cardiovascular:     Rate and Rhythm: Normal rate and regular rhythm.     Pulses: Normal pulses.     Heart sounds: Normal heart sounds.  Pulmonary:     Effort: Pulmonary effort is normal.     Breath  sounds: Normal breath sounds.  Musculoskeletal:        General: Normal range of motion.     Cervical back: Normal range of motion and neck supple.  Skin:    General: Skin is warm and dry.  Neurological:     Mental Status: She is alert and oriented to person, place, and time.  Psychiatric:        Mood and Affect: Mood normal.        Behavior: Behavior normal.        Thought Content: Thought content normal.        Judgment: Judgment normal.     BP (!) 100/58 (BP Location: Left Arm, Patient Position: Sitting, Cuff Size: Normal)   Pulse 80   Temp 98.4 F (36.9 C) (Oral)   Ht 5\' 1"  (1.549 m)  Wt 131 lb (59.4 kg)   SpO2 99%   BMI 24.75 kg/m  Wt Readings from Last 3 Encounters:  03/06/20 131 lb (59.4 kg)  12/26/19 131 lb (59.4 kg)  05/02/19 129 lb (58.5 kg)     Health Maintenance Due  Topic Date Due  . Hepatitis C Screening  Never done  . TETANUS/TDAP  04/15/2019  . COVID-19 Vaccine (2 - Pfizer 2-dose series) 01/14/2020    There are no preventive care reminders to display for this patient.  Lab Results  Component Value Date   TSH 1.10 01/14/2020   Lab Results  Component Value Date   WBC 2.8 (L) 01/14/2020   HGB 12.4 01/14/2020   HCT 37.5 01/14/2020   MCV 84.1 01/14/2020   PLT 232.0 01/14/2020   Lab Results  Component Value Date   NA 138 12/26/2019   K 4.0 12/26/2019   CO2 30 12/26/2019   GLUCOSE 99 12/26/2019   BUN 7 12/26/2019   CREATININE 0.75 12/26/2019   BILITOT 0.2 12/26/2019   ALKPHOS 54 12/26/2019   AST 12 12/26/2019   ALT 10 12/26/2019   PROT 7.3 12/26/2019   ALBUMIN 4.2 12/26/2019   CALCIUM 9.3 12/26/2019   GFR 117.14 12/26/2019   Lab Results  Component Value Date   CHOL 185 12/26/2019   Lab Results  Component Value Date   HDL 62.40 12/26/2019   Lab Results  Component Value Date   LDLCALC 111 (H) 12/26/2019   Lab Results  Component Value Date   TRIG 61.0 12/26/2019   Lab Results  Component Value Date   CHOLHDL 3 12/26/2019    No results found for: HGBA1C    Assessment & Plan:   Problem List Items Addressed This Visit      Other   Neck swelling - Primary   Relevant Orders   Ambulatory referral to ENT   Neutropenia [facility])   Relevant Orders   Ambulatory referral to Hematology   B12 deficiency   Low vitamin D level      Meds ordered this encounter  Medications  . DISCONTD: Cyanocobalamin (B-12) 1000 MCG CAPS    Sig: Take 1 capsule by mouth daily.    Dispense:  30 capsule    Refill:  3    Order Specific Question:   Supervising Provider    Answer:   Dale [place] [id number]  . DISCONTD: Cholecalciferol 25 MCG (1000 UT) tablet    Sig: Take 1 tablet (1,000 Units total) by mouth daily.    Dispense:  30 tablet    Refill:  3    Order Specific Question:   Supervising Provider    Answer:   Dale [place] [id number]  . Cyanocobalamin (B-12) 1000 MCG CAPS    Sig: Take 1 capsule by mouth daily.    Dispense:  30 capsule    Refill:  0    Order Specific Question:   Supervising Provider    Answer:   Dale Helper [id number]  . Cholecalciferol 25 MCG (1000 UT) tablet    Sig: Take 1 tablet (1,000 Units total) by mouth daily.    Dispense:  30 tablet    Refill:  0    Order Specific Question:   Supervising Provider    Answer:   Dale [place]our [[id number]]   You will get a phone call to set up an ENT appointment for neck swelling.   You will get a phone call to set up an a hematology appointment to evaluate your abnormal CBC.  Please go to with these appointments to complete evaluation.  Follow up when able in the fall to get the Tetanus vaccine.   Follow-up: Return in about 3 months (around 06/06/2020).  This visit occurred during the SARS-CoV-2 public health emergency.  Safety protocols were in place, including screening questions prior to the visit, additional usage of staff PPE, and extensive cleaning of exam room while observing appropriate contact time as indicated for disinfecting solutions.      Amedeo Kinsman, NP

## 2020-03-06 NOTE — Patient Instructions (Addendum)
You will get a phone call to set up an ENT appointment for neck swelling.   You will get a phone call to set up an a hematology appointment to evaluate your abnormal CBC.  Please go to with these appointments to complete evaluation.  Follow up when able in the fall to get the Tetanus vaccine.   Take the B12 1000 mcg  one pill by mouth every day for  3 months and we will recehck lab test in 3 mos.   Take Vit D 3 1000 IU one pill by mouth every day for 3 months and we will recheck lab test in 3 mos

## 2020-03-09 ENCOUNTER — Encounter: Payer: Self-pay | Admitting: Nurse Practitioner

## 2020-03-09 ENCOUNTER — Telehealth: Payer: Self-pay | Admitting: Nurse Practitioner

## 2020-03-09 DIAGNOSIS — R7989 Other specified abnormal findings of blood chemistry: Secondary | ICD-10-CM | POA: Insufficient documentation

## 2020-03-09 DIAGNOSIS — E538 Deficiency of other specified B group vitamins: Secondary | ICD-10-CM | POA: Insufficient documentation

## 2020-03-09 MED ORDER — B-12 1000 MCG PO CAPS
1.0000 | ORAL_CAPSULE | Freq: Every day | ORAL | 3 refills | Status: DC
Start: 2020-03-09 — End: 2020-03-09

## 2020-03-09 MED ORDER — CHOLECALCIFEROL 25 MCG (1000 UT) PO TABS
1000.0000 [IU] | ORAL_TABLET | Freq: Every day | ORAL | 3 refills | Status: DC
Start: 2020-03-09 — End: 2020-03-09

## 2020-03-09 MED ORDER — CHOLECALCIFEROL 25 MCG (1000 UT) PO TABS
1000.0000 [IU] | ORAL_TABLET | Freq: Every day | ORAL | 0 refills | Status: DC
Start: 2020-03-09 — End: 2020-06-23

## 2020-03-09 MED ORDER — B-12 1000 MCG PO CAPS
1.0000 | ORAL_CAPSULE | Freq: Every day | ORAL | 0 refills | Status: AC
Start: 2020-03-09 — End: 2020-04-08

## 2020-03-09 NOTE — Telephone Encounter (Signed)
Please call her and advise that she start on B12 and Vit D supplements- previously ordered in April, but I do not think she took them. Please verify.    If she did not start the B12 and Vit D 3 supplements, I gave her RX for both at her pharmacy. I would like to recheck her labs in Oct/Nov or when she is back home to [place] in the fall. Please arrange a follow- up OV with me then.   She has a Hematology appt arranged this week.  Can you check on her ENT appt? Thank you.

## 2020-03-10 NOTE — Telephone Encounter (Signed)
Patient aware to start taking rxs sent for B12 and vit D. Patient scheduled f/u for 06/23/20 at 9am

## 2020-03-12 ENCOUNTER — Inpatient Hospital Stay: Payer: 59 | Attending: Oncology | Admitting: Oncology

## 2020-03-12 ENCOUNTER — Encounter: Payer: Self-pay | Admitting: Oncology

## 2020-03-12 ENCOUNTER — Inpatient Hospital Stay: Payer: 59

## 2020-03-12 ENCOUNTER — Other Ambulatory Visit: Payer: Self-pay

## 2020-03-12 VITALS — BP 110/73 | HR 74 | Temp 96.4°F | Resp 18 | Wt 131.7 lb

## 2020-03-12 DIAGNOSIS — D709 Neutropenia, unspecified: Secondary | ICD-10-CM

## 2020-03-12 NOTE — Progress Notes (Signed)
Hematology/Oncology Consult note [facility] [phone] [phone]  Patient Care Team: Marval Regal, NP as PCP - General (Nurse Practitioner)   Name of the patient: Connie Stein  [id number]  December 06, 1995    Reason for referral-neutropenia   Referring physician-Kimberly Jerelene Redden, NP  Date of visit: 03/12/20   History of presenting illness-patient is a 24 year old African-American female with no significant past medical history referred for neutropenia.CBC with differential on 12/26/2019 for white count of 2.1, H&H of 12.5/37.9 with a platelet count of 201.  Differential showed neutropenia with an [place] of 700.  Repeat count on 01/14/2020 showed an ANC of 1.1 again with a normal hemoglobin and normal platelet count.  She reports feeling well overall.  Appetite and weight have remained stable.  She denies any unintentional weight loss.  Denies any recurrent infections.  Denies any joint pain or joint swelling.  Denies any skin rash.  Denies any over-the-counter medications or herbal supplements. Recent blood work revealed a low B12 level of 210.  HIV and hepatitis testing was negative.  ECOG PS- 0  Pain scale- 0   Review of systems- Review of Systems  Constitutional: Negative for chills, fever, malaise/fatigue and weight loss.  HENT: Negative for congestion, ear discharge and nosebleeds.   Eyes: Negative for blurred vision.  Respiratory: Negative for cough, hemoptysis, sputum production, shortness of breath and wheezing.   Cardiovascular: Negative for chest pain, palpitations, orthopnea and claudication.  Gastrointestinal: Negative for abdominal pain, blood in stool, constipation, diarrhea, heartburn, melena, nausea and vomiting.  Genitourinary: Negative for dysuria, flank pain, frequency, hematuria and urgency.  Musculoskeletal: Negative for back pain, joint pain and myalgias.  Skin: Negative for rash.  Neurological: Negative for dizziness,  tingling, focal weakness, seizures, weakness and headaches.  Endo/Heme/Allergies: Does not bruise/bleed easily.  Psychiatric/Behavioral: Negative for depression and suicidal ideas. The patient does not have insomnia.     Allergies  Allergen Reactions  . Amoxicillin Hives and Other (See Comments)    Patient Active Problem List   Diagnosis Date Noted  . B12 deficiency 03/09/2020  . Low vitamin D level 03/09/2020  . Neck swelling 03/06/2020  . Neutropenia ([place]) 03/06/2020  . Healthy adult on routine physical examination 12/26/2019     Past Medical History:  Diagnosis Date  . BV (bacterial vaginosis)   . Healthy adult on routine physical examination 12/26/2019  . Neck swelling 03/06/2020  . Neutropenia ([place]) 03/06/2020     Past Surgical History:  Procedure Laterality Date  . THERAPEUTIC ABORTION  2019  . TONSILLECTOMY    . TOOTH EXTRACTION      Social History   Socioeconomic History  . Marital status: Single    Spouse name: Not on file  . Number of children: Not on file  . Years of education: Not on file  . Highest education level: Not on file  Occupational History  . Not on file  Tobacco Use  . Smoking status: Never Smoker  . Smokeless tobacco: Never Used  Vaping Use  . Vaping Use: Never used  Substance and Sexual Activity  . Alcohol use: Yes    Comment: On occasion  . Drug use: Never  . Sexual activity: Yes    Birth control/protection: Pill  Other Topics Concern  . Not on file  Social History Narrative  . Not on file   Social Determinants of Health   Financial Resource Strain:   . Difficulty of Paying Living Expenses:   Food Insecurity:   .  Worried About Charity fundraiser in the Last Year:   . Arboriculturist in the Last Year:   Transportation Needs:   . Film/video editor (Medical):   Marland Kitchen Lack of Transportation (Non-Medical):   Physical Activity:   . Days of Exercise per Week:   . Minutes of Exercise per Session:   Stress:   . Feeling of  Stress :   Social Connections:   . Frequency of Communication with Friends and Family:   . Frequency of Social Gatherings with Friends and Family:   . Attends Religious Services:   . Active Member of Clubs or Organizations:   . Attends Archivist Meetings:   Marland Kitchen Marital Status:   Intimate Partner Violence:   . Fear of Current or Ex-Partner:   . Emotionally Abused:   Marland Kitchen Physically Abused:   . Sexually Abused:      Family History  Problem Relation Age of Onset  . Diabetes Mother      Current Outpatient Medications:  .  Cholecalciferol 25 MCG (1000 UT) tablet, Take 1 tablet (1,000 Units total) by mouth daily., Disp: 30 tablet, Rfl: 0 .  Cyanocobalamin (B-12) 1000 MCG CAPS, Take 1 capsule by mouth daily., Disp: 30 capsule, Rfl: 0 .  levonorgestrel-ethinyl estradiol (AVIANE) 0.1-20 MG-MCG tablet, Take 1 tablet by mouth daily., Disp: 84 tablet, Rfl: 1   Physical exam:  Vitals:   03/12/20 1139  BP: 110/73  Pulse: 74  Resp: 18  Temp: (!) 96.4 F (35.8 C)  TempSrc: Tympanic  SpO2: 100%  Weight: 131 lb 11.2 oz (59.7 kg)   Physical Exam Constitutional:      General: She is not in acute distress. Cardiovascular:     Rate and Rhythm: Normal rate and regular rhythm.     Heart sounds: Normal heart sounds.  Pulmonary:     Effort: Pulmonary effort is normal.     Breath sounds: Normal breath sounds.  Abdominal:     General: Bowel sounds are normal.     Palpations: Abdomen is soft.  Lymphadenopathy:     Comments: No palpable cervical, supraclavicular, axillary or inguinal adenopathy   Skin:    General: Skin is warm and dry.  Neurological:     Mental Status: She is alert and oriented to person, place, and time.        CMP Latest Ref Rng & Units 12/26/2019  Glucose 70 - 99 mg/dL 99  BUN 6 - 23 mg/dL 7  Creatinine 0.40 - 1.20 mg/dL 0.75  Sodium 135 - 145 mEq/L 138  Potassium 3.5 - 5.1 mEq/L 4.0  Chloride 96 - 112 mEq/L 104  CO2 19 - 32 mEq/L 30  Calcium 8.4 -  10.5 mg/dL 9.3  Total Protein 6.0 - 8.3 g/dL 7.3  Total Bilirubin 0.2 - 1.2 mg/dL 0.2  Alkaline Phos 39 - 117 U/L 54  AST 0 - 37 U/L 12  ALT 0 - 35 U/L 10   CBC Latest Ref Rng & Units 01/14/2020  WBC 4.0 - 10.5 K/uL 2.8(L)  Hemoglobin 12.0 - 15.0 g/dL 12.4  Hematocrit 36 - 46 % 37.5  Platelets 150 - 400 K/uL 232.0    Assessment and plan- Patient is a 24 y.o. female referred for neutropenia  I have 2 CBCs for comparison which shows isolated neutropenia with an [place] that was between [phone].  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  Sometimes B12 deficiency can also cause neutropenia  and she has not yet started taking her B12 supplementation.   Repeat CBC with differential in 2 months and 6 months and I will see her in 6 months.  B12 level to be checked in 2 months.  Given that she is asymptomatic overall with isolated neutropenia and no concerning findings on physical exam she does not require a bone marrow biopsy at this time   Thank you for this kind referral and the opportunity to participate in the care of this patient   Visit Diagnosis 1. Neutropenia, unspecified type [facility])     Dr. Randa Evens, MD, MPH [facility] [phone] 03/12/2020 4:07 PM

## 2020-03-24 ENCOUNTER — Ambulatory Visit (INDEPENDENT_AMBULATORY_CARE_PROVIDER_SITE_OTHER): Payer: 59 | Admitting: Psychology

## 2020-03-24 DIAGNOSIS — F411 Generalized anxiety disorder: Secondary | ICD-10-CM | POA: Diagnosis not present

## 2020-03-25 ENCOUNTER — Telehealth: Payer: Self-pay | Admitting: Nurse Practitioner

## 2020-03-25 NOTE — Telephone Encounter (Signed)
Pt needs a copy of immunization records. She would like a call when these are ready.

## 2020-03-25 NOTE — Telephone Encounter (Signed)
Records printed and are upfront for pickup; patient is aware

## 2020-04-07 ENCOUNTER — Ambulatory Visit (INDEPENDENT_AMBULATORY_CARE_PROVIDER_SITE_OTHER): Payer: 59 | Admitting: Psychology

## 2020-04-07 DIAGNOSIS — F411 Generalized anxiety disorder: Secondary | ICD-10-CM

## 2020-04-30 ENCOUNTER — Ambulatory Visit: Payer: 59 | Admitting: Psychology

## 2020-05-14 ENCOUNTER — Inpatient Hospital Stay: Payer: 59

## 2020-05-29 ENCOUNTER — Other Ambulatory Visit: Payer: Self-pay | Admitting: Obstetrics and Gynecology

## 2020-05-31 ENCOUNTER — Other Ambulatory Visit: Payer: Self-pay | Admitting: Nurse Practitioner

## 2020-06-03 ENCOUNTER — Telehealth: Payer: Self-pay | Admitting: Nurse Practitioner

## 2020-06-03 ENCOUNTER — Encounter: Payer: Self-pay | Admitting: Nurse Practitioner

## 2020-06-03 ENCOUNTER — Telehealth: Payer: Self-pay

## 2020-06-03 MED ORDER — LEVONORGESTREL-ETHINYL ESTRAD 0.1-20 MG-MCG PO TABS: 1.0000 | ORAL_TABLET | Freq: Every day | ORAL | 1 refills | Status: DC

## 2020-06-03 NOTE — Telephone Encounter (Signed)
Pt calling; is unable to request refill on birthcontrol thru MyChart; pharm told her to contact us.  [phone]

## 2020-06-03 NOTE — Telephone Encounter (Signed)
Patient needs a refill on her levonorgestrel-ethinyl estradiol (AVIANE) 0.1-20 MG-MCG tablet. She only has 1 week and half remaining.

## 2020-06-04 NOTE — Telephone Encounter (Signed)
Patient is scheduled for 07/29/20 with [facility] for Annual.

## 2020-06-04 NOTE — Telephone Encounter (Signed)
Pt aware bcp refill yesterday by Fransico Setters, NP.

## 2020-06-23 ENCOUNTER — Encounter: Payer: Self-pay | Admitting: Nurse Practitioner

## 2020-06-23 ENCOUNTER — Other Ambulatory Visit: Payer: 59

## 2020-06-23 ENCOUNTER — Other Ambulatory Visit: Payer: Self-pay

## 2020-06-23 ENCOUNTER — Telehealth (INDEPENDENT_AMBULATORY_CARE_PROVIDER_SITE_OTHER): Payer: 59 | Admitting: Nurse Practitioner

## 2020-06-23 VITALS — Temp 97.9°F | Ht 61.0 in | Wt 134.0 lb

## 2020-06-23 DIAGNOSIS — R7989 Other specified abnormal findings of blood chemistry: Secondary | ICD-10-CM | POA: Diagnosis not present

## 2020-06-23 DIAGNOSIS — E538 Deficiency of other specified B group vitamins: Secondary | ICD-10-CM | POA: Diagnosis not present

## 2020-06-23 DIAGNOSIS — D709 Neutropenia, unspecified: Secondary | ICD-10-CM

## 2020-06-23 NOTE — Patient Instructions (Addendum)
Please find the [facility] site that you want to have your blood drawn in [facility].  Send [place] a MyChart message with their fax number.  We will fax in the printed order for your CBC with differential.  I have not added a B12 as you do not think it will be changed.  Try again the B12 1000 mcg daily.  Take it with food.  It should not cause a headache.  If you tolerate this, stay on it and we can recheck your level in 3 months.  Once you tolerate B12 and you are doing well, I would like you to add back vitamin D 3 at 800 IU daily.  We can recheck that level in 3 months as well.  Please call the office in 3 months for repeat lab check .   Vitamin B12 Deficiency Vitamin B12 deficiency occurs when the body does not have enough vitamin B12, which is an important vitamin. The body needs this vitamin:  To make red blood cells.  To make DNA. This is the genetic material inside cells.  To help the nerves work properly so they can carry messages from the brain to the body. Vitamin B12 deficiency can cause various health problems, such as a low red blood cell count (anemia) or nerve damage. What are the causes? This condition may be caused by:  Not eating enough foods that contain vitamin B12.  Not having enough stomach acid and digestive fluids to properly absorb vitamin B12 from the food that you eat.  Certain digestive system diseases that make it hard to absorb vitamin B12. These diseases include Crohn's disease, chronic pancreatitis, and cystic fibrosis.  A condition in which the body does not make enough of a protein (intrinsic factor), resulting in too few red blood cells (pernicious anemia).  Having a surgery in which part of the stomach or small intestine is removed.  Taking certain medicines that make it hard for the body to absorb vitamin B12. These medicines include: ? Heartburn medicines (antacids and proton pump inhibitors). ? Certain antibiotic medicines. ? Some  medicines that are used to treat diabetes, tuberculosis, gout, or high cholesterol. What increases the risk? The following factors may make you more likely to develop a B12 deficiency:  Being older than age 70.  Eating a vegetarian or vegan diet, especially while you are pregnant.  Eating a poor diet while you are pregnant.  Taking certain medicines.  Having alcoholism. What are the signs or symptoms? In some cases, there are no symptoms of this condition. If the condition leads to anemia or nerve damage, various symptoms can occur, such as:  Weakness.  Fatigue.  Loss of appetite.  Weight loss.  Numbness or tingling in your hands and feet.  Redness and burning of the tongue.  Confusion or memory problems.  Depression.  Sensory problems, such as color blindness, ringing in the ears, or loss of taste.  Diarrhea or constipation.  Trouble walking. If anemia is severe, symptoms can include:  Shortness of breath.  Dizziness.  Rapid heart rate (tachycardia). How is this diagnosed? This condition may be diagnosed with a blood test to measure the level of vitamin B12 in your blood. You may also have other tests, including:  A group of tests that measure certain characteristics of blood cells (complete blood count, CBC).  A blood test to measure intrinsic factor.  A procedure where a thin tube with a camera on the end is used to look into your  stomach or intestines (endoscopy). Other tests may be needed to discover the cause of B12 deficiency. How is this treated? Treatment for this condition depends on the cause. This condition may be treated by:  Changing your eating and drinking habits, such as: ? Eating more foods that contain vitamin B12. ? Drinking less alcohol or no alcohol.  Getting vitamin B12 injections.  Taking vitamin B12 supplements. Your health care provider will tell you which dosage is best for you. Follow these instructions at home: Eating and  drinking   Eat lots of healthy foods that contain vitamin B12, including: ? Meats and poultry. This includes beef, pork, chicken, [place], and organ meats, such as liver. ? Seafood. This includes clams, rainbow trout, salmon, tuna, and haddock. ? Eggs. ? Cereal and dairy products that are fortified. This means that vitamin B12 has been added to the food. Check the label on the package to see if the food is fortified. The items listed above may not be a complete list of recommended foods and beverages. Contact a dietitian for more information. General instructions  Get any injections that are prescribed by your health care provider.  Take supplements only as told by your health care provider. Follow the directions carefully.  Do not drink alcohol if your health care provider tells you not to. In some cases, you may only be asked to limit alcohol use.  Keep all follow-up visits as told by your health care provider. This is important. Contact a health care provider if:  Your symptoms come back. Get help right away if you:  Develop shortness of breath.  Have a rapid heart rate.  Have chest pain.  Become dizzy or lose consciousness. Summary  Vitamin B12 deficiency occurs when the body does not have enough vitamin B12.  The main causes of vitamin B12 deficiency include dietary deficiency, digestive diseases, pernicious anemia, and having a surgery in which part of the stomach or small intestine is removed.  In some cases, there are no symptoms of this condition. If the condition leads to anemia or nerve damage, various symptoms can occur, such as weakness, shortness of breath, and numbness.  Treatment may include getting vitamin B12 injections or taking vitamin B12 supplements. Eat lots of healthy foods that contain vitamin B12. This information is not intended to replace advice given to you by your health care provider. Make sure you discuss any questions you have with your health  care provider. Document Revised: 02/01/2019 Document Reviewed: 04/24/2018 Elsevier Patient Education  2020 [employer]  Vitamin B12 Deficiency Vitamin B12 deficiency occurs when the body does not have enough vitamin B12, which is an important vitamin. The body needs this vitamin:  To make red blood cells.  To make DNA. This is the genetic material inside cells.  To help the nerves work properly so they can carry messages from the brain to the body. Vitamin B12 deficiency can cause various health problems, such as a low red blood cell count (anemia) or nerve damage. What are the causes? This condition may be caused by:  Not eating enough foods that contain vitamin B12.  Not having enough stomach acid and digestive fluids to properly absorb vitamin B12 from the food that you eat.  Certain digestive system diseases that make it hard to absorb vitamin B12. These diseases include Crohn's disease, chronic pancreatitis, and cystic fibrosis.  A condition in which the body does not make enough of a protein (intrinsic factor), resulting in too few red blood  cells (pernicious anemia).  Having a surgery in which part of the stomach or small intestine is removed.  Taking certain medicines that make it hard for the body to absorb vitamin B12. These medicines include: ? Heartburn medicines (antacids and proton pump inhibitors). ? Certain antibiotic medicines. ? Some medicines that are used to treat diabetes, tuberculosis, gout, or high cholesterol. What increases the risk? The following factors may make you more likely to develop a B12 deficiency:  Being older than age 550.  Eating a vegetarian or vegan diet, especially while you are pregnant.  Eating a poor diet while you are pregnant.  Taking certain medicines.  Having alcoholism. What are the signs or symptoms? In some cases, there are no symptoms of this condition. If the condition leads to anemia or nerve damage, various symptoms  can occur, such as:  Weakness.  Fatigue.  Loss of appetite.  Weight loss.  Numbness or tingling in your hands and feet.  Redness and burning of the tongue.  Confusion or memory problems.  Depression.  Sensory problems, such as color blindness, ringing in the ears, or loss of taste.  Diarrhea or constipation.  Trouble walking. If anemia is severe, symptoms can include:  Shortness of breath.  Dizziness.  Rapid heart rate (tachycardia). How is this diagnosed? This condition may be diagnosed with a blood test to measure the level of vitamin B12 in your blood. You may also have other tests, including:  A group of tests that measure certain characteristics of blood cells (complete blood count, CBC).  A blood test to measure intrinsic factor.  A procedure where a thin tube with a camera on the end is used to look into your stomach or intestines (endoscopy). Other tests may be needed to discover the cause of B12 deficiency. How is this treated? Treatment for this condition depends on the cause. This condition may be treated by:  Changing your eating and drinking habits, such as: ? Eating more foods that contain vitamin B12. ? Drinking less alcohol or no alcohol.  Getting vitamin B12 injections.  Taking vitamin B12 supplements. Your health care provider will tell you which dosage is best for you. Follow these instructions at home: Eating and drinking   Eat lots of healthy foods that contain vitamin B12, including: ? Meats and poultry. This includes beef, pork, chicken, [place], and organ meats, such as liver. ? Seafood. This includes clams, rainbow trout, salmon, tuna, and haddock. ? Eggs. ? Cereal and dairy products that are fortified. This means that vitamin B12 has been added to the food. Check the label on the package to see if the food is fortified. The items listed above may not be a complete list of recommended foods and beverages. Contact a dietitian for more  information. General instructions  Get any injections that are prescribed by your health care provider.  Take supplements only as told by your health care provider. Follow the directions carefully.  Do not drink alcohol if your health care provider tells you not to. In some cases, you may only be asked to limit alcohol use.  Keep all follow-up visits as told by your health care provider. This is important. Contact a health care provider if:  Your symptoms come back. Get help right away if you:  Develop shortness of breath.  Have a rapid heart rate.  Have chest pain.  Become dizzy or lose consciousness. Summary  Vitamin B12 deficiency occurs when the body does not have enough vitamin B12.  The main causes of vitamin B12 deficiency include dietary deficiency, digestive diseases, pernicious anemia, and having a surgery in which part of the stomach or small intestine is removed.  In some cases, there are no symptoms of this condition. If the condition leads to anemia or nerve damage, various symptoms can occur, such as weakness, shortness of breath, and numbness.  Treatment may include getting vitamin B12 injections or taking vitamin B12 supplements. Eat lots of healthy foods that contain vitamin B12. This information is not intended to replace advice given to you by your health care provider. Make sure you discuss any questions you have with your health care provider. Document Revised: 02/01/2019 Document Reviewed: 04/24/2018 Elsevier Patient Education  2020 [employer].

## 2020-06-23 NOTE — Progress Notes (Signed)
Virtual Visit via Video Note  This visit type was conducted due to national recommendations for restrictions regarding the COVID-19 pandemic (e.g. social distancing).  This format is felt to be most appropriate for this patient at this time.  All issues noted in this document were discussed and addressed.  No physical exam was performed (except for noted visual exam findings with Video Visits).   I connected with@ on 06/23/20 at  9:00 AM EDT by a video enabled telemedicine application or telephone and verified that I am speaking with the correct person using two identifiers. Location patient: car Location provider: work or home office Persons participating in the virtual visit: patient, provider  I discussed the limitations, risks, security and privacy concerns of performing an evaluation and management service by telephone and the availability of in person appointments. I also discussed with the patient that there may be a patient responsible charge related to this service. The patient expressed understanding and agreed to proceed.  Interactive audio and video telecommunications were attempted between this provider and patient, however failed, due to patient having technical difficulties OR patient did not have access to video capability.  We continued and completed visit with audio only.   Reason for visit: routine f/up of neutropenia. Lymph nodes in her neck.   HPI: Connie Stein is a healthy 24 yo who is in [place] area working on her [employer] She cannot come home for labs. [facility]emonc wants a repeat  CBC w diff to follow neutropenia.  She has had no symptoms with neutropenia.  She is African-American.  Testing so far by hematology has revealed no cytopenias.  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  Sometimes B12 deficiency can also cause neutropenia.  In July, she was advised to start B12.  She reports today that she tried taking B12 supplements for 3 weeks and it caused a headache.   But now she is not sure if it was the vitamin D that cause that instead.  She does not want her B12 level drawn as she reports it is likely unchanged.  Her B12 level 5 months ago was 210, folate is 11.7.    She had what looked like swollen neck nodes, thyroid study was negative, ENT evaluation negative, and Hemoccult has found no adenopathy.  The patient has no fevers or chills, weight loss, night sweats, chest pain, shortness of breath, DOE, joint pain, rashes depression or anxiety.  She feels quite well.   ROS: See pertinent positives and negatives per HPI.  She has had both of Pfizer Covid vaccine most recent 01/14/20.  Past Medical History:  Diagnosis Date  . BV (bacterial vaginosis)   . Healthy adult on routine physical examination 12/26/2019  . Neck swelling 03/06/2020  . Neutropenia (HCC) 03/06/2020    Past Surgical History:  Procedure Laterality Date  . THERAPEUTIC ABORTION  2019  . TONSILLECTOMY    . TOOTH EXTRACTION      Family History  Problem Relation Age of Onset  . Diabetes Mother     SOCIAL HX: No tobacco   Current Outpatient Medications:  .  levonorgestrel-ethinyl estradiol (AVIANE) 0.1-20 MG-MCG tablet, Take 1 tablet by mouth daily., Disp: 84 tablet, Rfl: 1  EXAM:  VITALS per patient if applicable: Weight 134 temp 97.9  GENERAL: alert, oriented, appears well and in no acute distress  HEENT: atraumatic, conjunctiva clear, no obvious abnormalities on inspection of external nose and ears  NECK: normal movements of the head and neck  LUNGS: on  inspection no signs of respiratory distress, breathing rate appears normal, no obvious gross SOB, gasping or wheezing  CV: no obvious cyanosis  MS: moves all visible extremities without noticeable abnormality  PSYCH/NEURO: pleasant and cooperative, no obvious depression or anxiety, speech and thought processing grossly intact  ASSESSMENT AND PLAN:  Discussed the following assessment and plan:  Neutropenia,  unspecified type (HCC) - Plan: CBC with Differential/Platelet  B12 deficiency  Low vitamin D level  Please find the [facility] site that you want to have your blood drawn in [facility].  Send [place] a MyChart message with their fax number.  We will fax in the printed order for your CBC with differential.  I have not added a B12 as you do not think it will be changed.  Try again the B12 1000 mcg daily.  Take it with food.  It should not cause a headache.  If you tolerate this, stay on it and we can recheck your level in 3 months.  Once you tolerate B12 and you are doing well, I would like you to add back vitamin D 3 at 800 IU daily.  We can recheck that level in 3 months as well.  Please call the office in 3 months for repeat lab check .    I discussed the assessment and treatment plan with the patient. The patient was provided an opportunity to ask questions and all were answered. The patient agreed with the plan and demonstrated an understanding of the instructions.   The patient was advised to call back or seek an in-person evaluation if the symptoms worsen or if the condition fails to improve as anticipated.  Amedeo Kinsman, NP Adult Nurse Practitioner [facility] [employer] [phone]

## 2020-07-15 IMAGING — US US THYROID
1 series · 14 of 25 positions shown · non-contrast
Comparison: None.

CLINICAL DATA: Thyroid enlargement

EXAM:
THYROID ULTRASOUND
TECHNIQUE: Ultrasound examination of the thyroid gland and adjacent soft
tissues was performed.

[Series 1: us thyroid · 0.06mm/px · 14 of 54 slices shown]
[im 1/54]
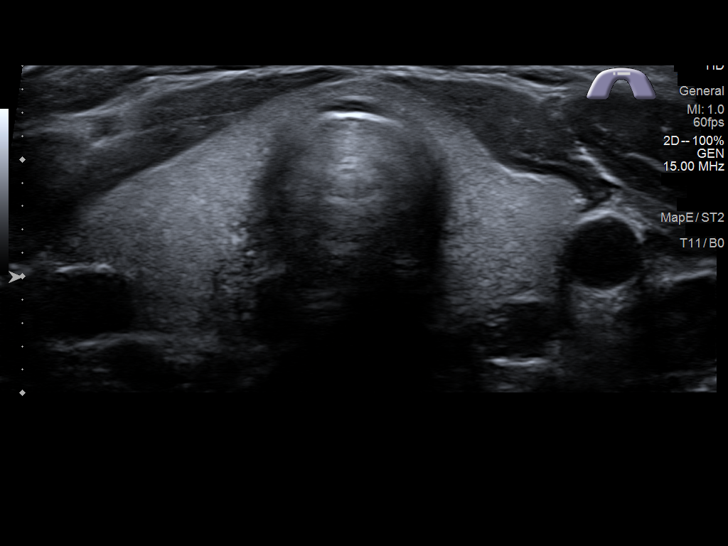
[im 5/54]
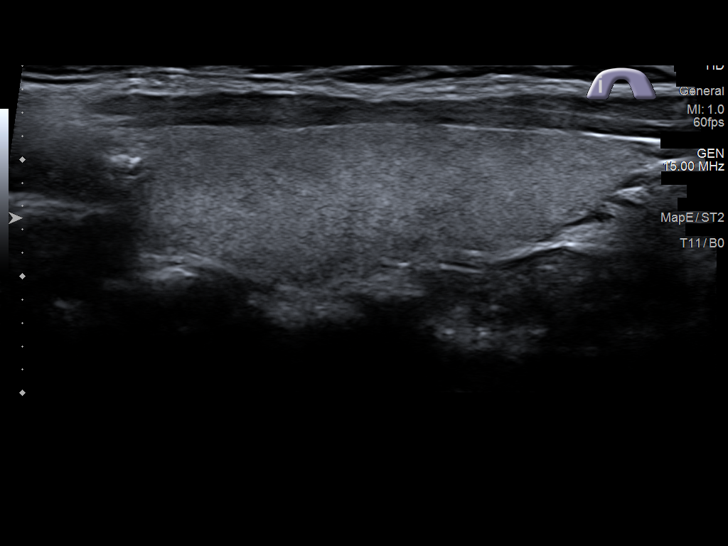
[im 9/54]
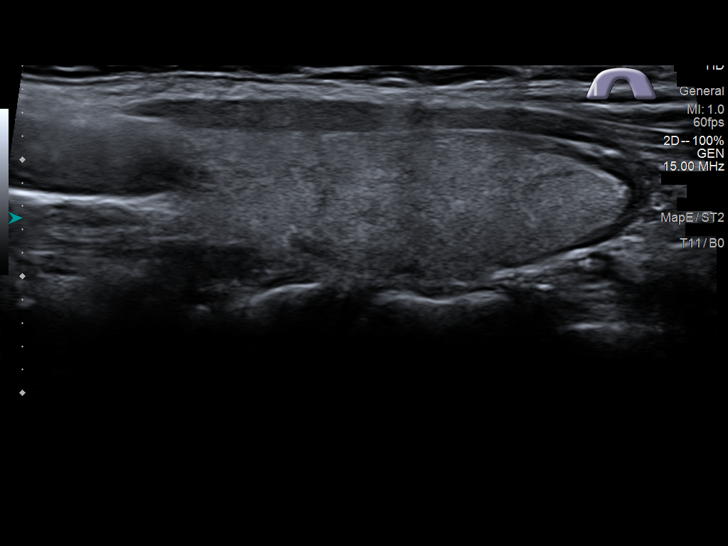
[im 14/54]
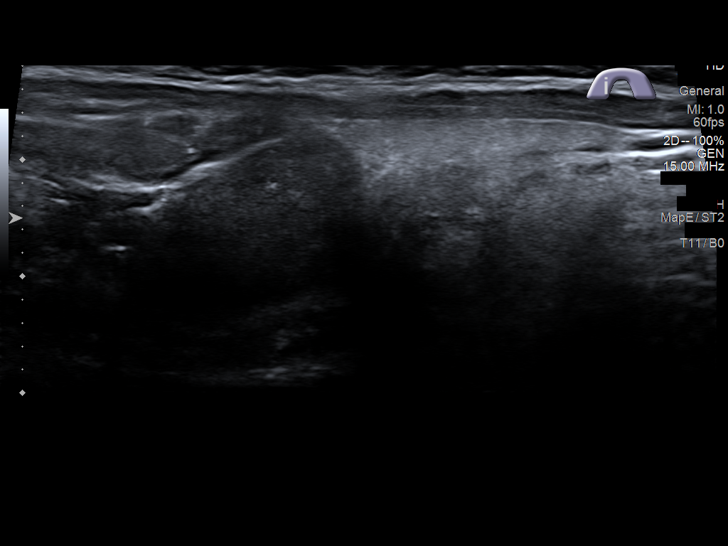
[im 18/54]
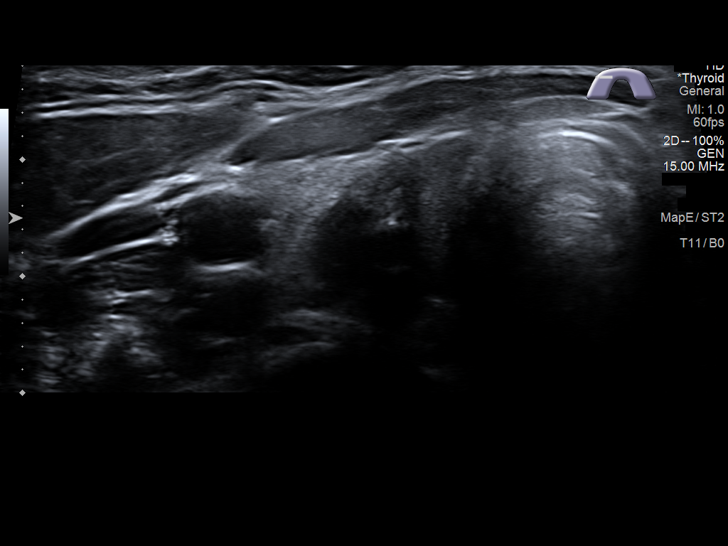
[im 20/54]
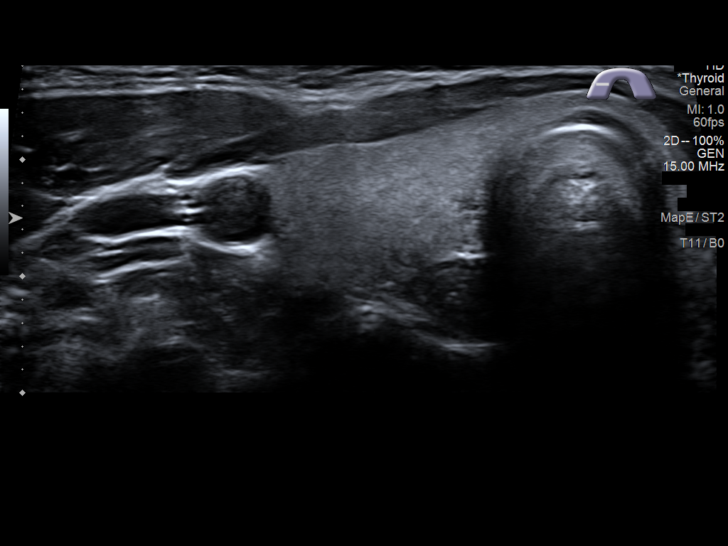
[im 25/54]
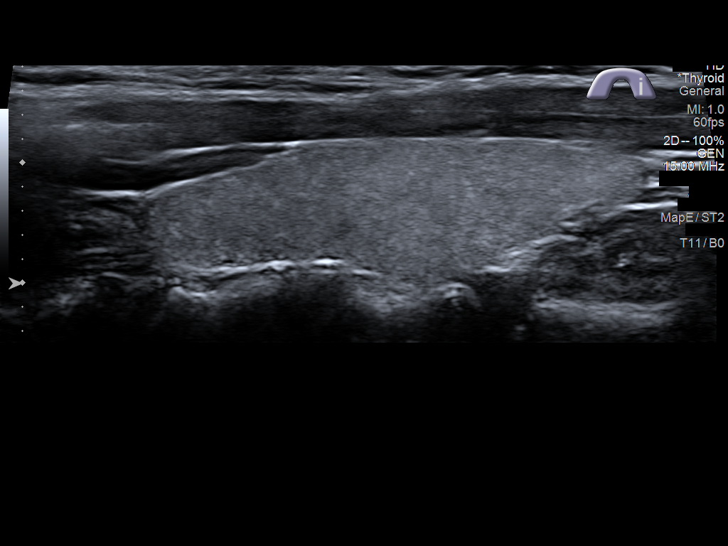
[im 29/54]
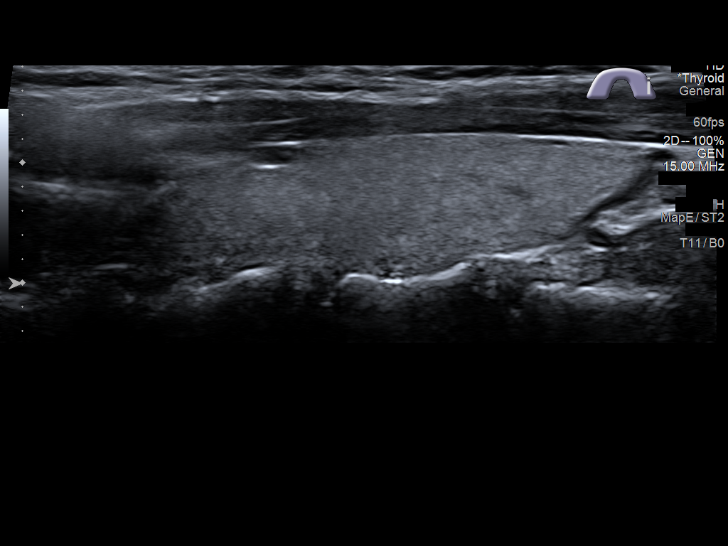
[im 34/54]
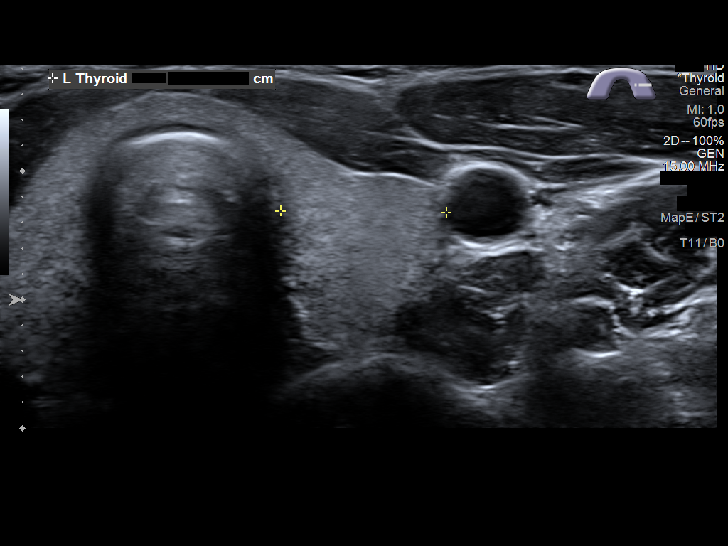
[im 36/54]
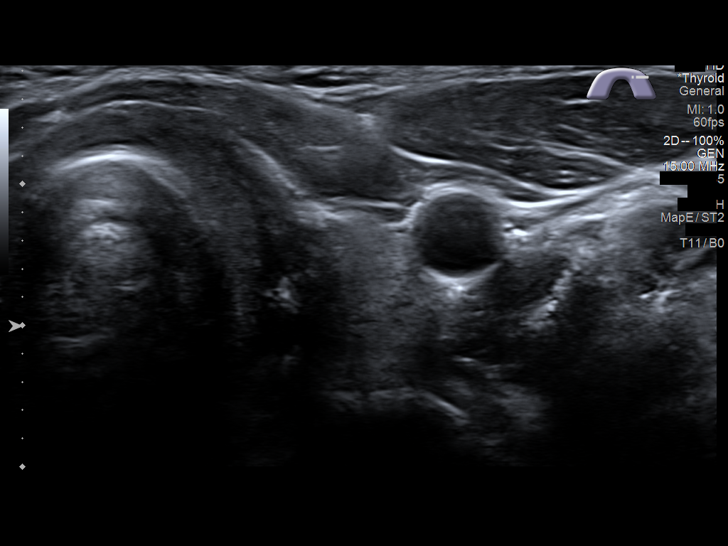
[im 40/54]
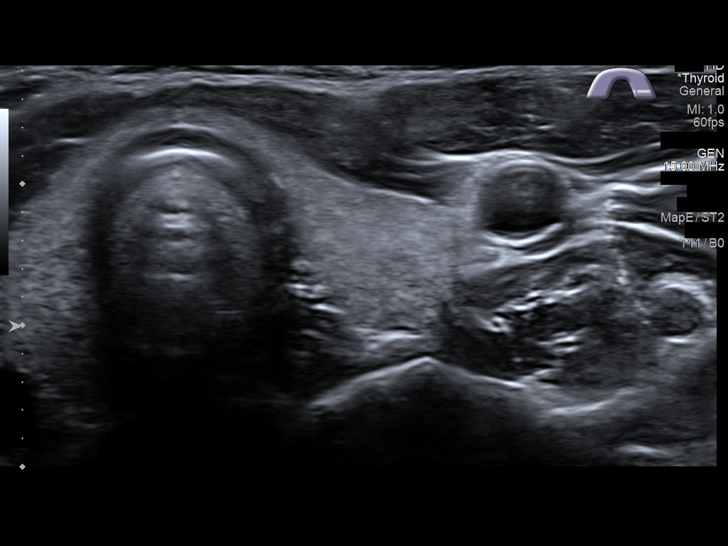
[im 45/54]
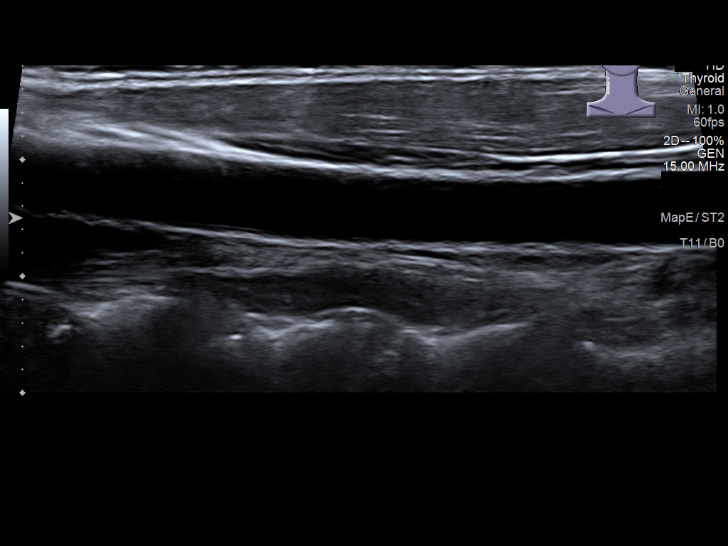
[im 49/54]
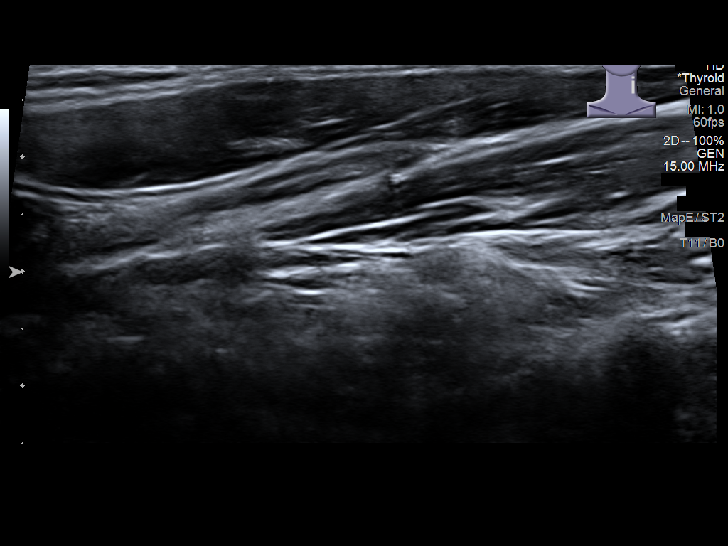
[im 54/54]
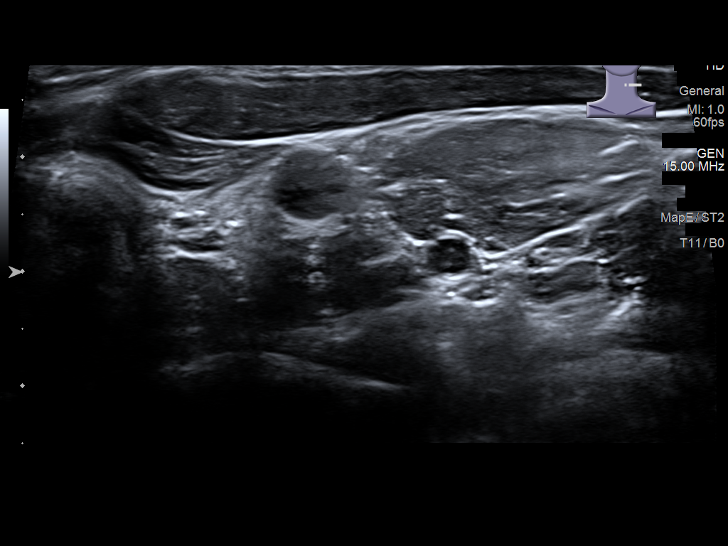

[14 of 25 positions shown; findings below may reference images not displayed]

FINDINGS: Parenchymal Echotexture: Normal

Isthmus: 2 mm

Right lobe: 4.3 x 1.4 x 1.7 cm

Left lobe: 4.3 x 1.2 x 1.3 cm

_________________________________________________________

Estimated total number of nodules >/= 1 cm: 0

Number of spongiform nodules >/=  2 cm not described below (TR1): 0

Number of mixed cystic and solid nodules >/= 1.5 cm not described
below (TR2): 0

_________________________________________________________

No discrete nodules are seen within the thyroid gland.
IMPRESSION: Normal thyroid ultrasound for age

The above is in keeping with the ACR TI-RADS recommendations - [HOSPITAL] 6332;[DATE].

## 2020-07-28 NOTE — Patient Instructions (Signed)
I value your feedback and entrusting us with your care. If you get a [facility] patient survey, I would appreciate you taking the time to let us know about your experience today. Thank you!  As of August 08, 2019, your lab results will be released to your MyChart immediately, before I even have a chance to see them. Please give me time to review them and contact you if there are any abnormalities. Thank you for your patience.  

## 2020-07-28 NOTE — Progress Notes (Signed)
PCP:  Theadore Nan, NP   Chief Complaint  Patient presents with  . Gynecologic Exam     HPI:      [name] is a 24 y.o. No obstetric history on file. who LMP was Patient's last menstrual period was 07/09/2020 (exact date)., presents today for her annual examination.  Her menses are regular every 28-30 days, lasting 4 days.  Dysmenorrhea minimal. She does not have intermenstrual bleeding.  Sex activity: single partner, contraception - OCP (estrogen/progesterone) Last Pap: 05/02/19 Results were normal Hx of STDs: none  There is no FH of breast cancer. There is no FH of ovarian cancer. The patient does do self-breast exams.  Tobacco use: The patient denies current or previous tobacco use. Alcohol use: social drinker No drug use.  Exercise: moderately active  She does not get adequate calcium and Vitamin D in her diet.  Gardasil declined again this yr   Past Medical History:  Diagnosis Date  . BV (bacterial vaginosis)   . Healthy adult on routine physical examination 12/26/2019  . Neck swelling 03/06/2020  . Neutropenia (HCC) 03/06/2020    Past Surgical History:  Procedure Laterality Date  . THERAPEUTIC ABORTION  2019  . TONSILLECTOMY    . TOOTH EXTRACTION      Family History  Problem Relation Age of Onset  . Diabetes Mother   . Lymphoma Paternal Grandmother     Social History   Socioeconomic History  . Marital status: Single    Spouse name: Not on file  . Number of children: Not on file  . Years of education: Not on file  . Highest education level: Not on file  Occupational History  . Not on file  Tobacco Use  . Smoking status: Never Smoker  . Smokeless tobacco: Never Used  Vaping Use  . Vaping Use: Never used  Substance and Sexual Activity  . Alcohol use: Yes    Comment: On occasion  . Drug use: Never  . Sexual activity: Yes    Birth control/protection: Pill  Other Topics Concern  . Not on file  Social History Narrative  . Not on file    Social Determinants of Health   Financial Resource Strain:   . Difficulty of Paying Living Expenses: Not on file  Food Insecurity:   . Worried About Programme researcher, broadcasting/film/video in the Last Year: Not on file  . Ran Out of Food in the Last Year: Not on file  Transportation Needs:   . Lack of Transportation (Medical): Not on file  . Lack of Transportation (Non-Medical): Not on file  Physical Activity:   . Days of Exercise per Week: Not on file  . Minutes of Exercise per Session: Not on file  Stress:   . Feeling of Stress : Not on file  Social Connections:   . Frequency of Communication with Friends and Family: Not on file  . Frequency of Social Gatherings with Friends and Family: Not on file  . Attends Religious Services: Not on file  . Active Member of Clubs or Organizations: Not on file  . Attends Banker Meetings: Not on file  . Marital Status: Not on file  Intimate Partner Violence:   . Fear of Current or Ex-Partner: Not on file  . Emotionally Abused: Not on file  . Physically Abused: Not on file  . Sexually Abused: Not on file    Outpatient Medications Prior to Visit  Medication Sig Dispense Refill  . levonorgestrel-ethinyl  estradiol (AVIANE) 0.1-20 MG-MCG tablet Take 1 tablet by mouth daily. 84 tablet 1   No facility-administered medications prior to visit.      ROS:  Review of Systems  Constitutional: Negative for fatigue, fever and unexpected weight change.  Respiratory: Negative for cough, shortness of breath and wheezing.   Cardiovascular: Negative for chest pain, palpitations and leg swelling.  Gastrointestinal: Negative for blood in stool, constipation, diarrhea, nausea and vomiting.  Endocrine: Negative for cold intolerance, heat intolerance and polyuria.  Genitourinary: Negative for dyspareunia, dysuria, flank pain, frequency, genital sores, hematuria, menstrual problem, pelvic pain, urgency, vaginal bleeding, vaginal discharge and vaginal pain.    Musculoskeletal: Negative for back pain, joint swelling and myalgias.  Skin: Negative for rash.  Neurological: Negative for dizziness, syncope, light-headedness, numbness and headaches.  Hematological: Negative for adenopathy.  Psychiatric/Behavioral: Negative for agitation, confusion, sleep disturbance and suicidal ideas. The patient is not nervous/anxious.   BREAST: No symptoms   Objective: BP 90/70   Ht 5\' 1"  (1.549 m)   Wt 141 lb (64 kg)   LMP 07/09/2020 (Exact Date)   BMI 26.64 kg/m    Physical Exam Constitutional:      Appearance: She is well-developed.  Genitourinary:     Vulva, vagina, uterus, right adnexa and left adnexa normal.     No vulval lesion or tenderness noted.     No vaginal discharge, erythema or tenderness.     No cervical motion tenderness or polyp.     Uterus is not enlarged or tender.     No right or left adnexal mass present.     Right adnexa not tender.     Left adnexa not tender.  Neck:     Thyroid: No thyromegaly.  Cardiovascular:     Rate and Rhythm: Normal rate and regular rhythm.     Heart sounds: Normal heart sounds. No murmur heard.   Pulmonary:     Effort: Pulmonary effort is normal.     Breath sounds: Normal breath sounds.  Chest:     Breasts:        Right: No mass, nipple discharge, skin change or tenderness.        Left: No mass, nipple discharge, skin change or tenderness.  Abdominal:     Palpations: Abdomen is soft.     Tenderness: There is no abdominal tenderness. There is no guarding.  Musculoskeletal:        General: Normal range of motion.     Cervical back: Normal range of motion.  Neurological:     General: No focal deficit present.     Mental Status: She is alert and oriented to person, place, and time.     Cranial Nerves: No cranial nerve deficit.  Skin:    General: Skin is warm and dry.  Psychiatric:        Mood and Affect: Mood normal.        Behavior: Behavior normal.        Thought Content: Thought content  normal.        Judgment: Judgment normal.  Vitals reviewed.     Assessment/Plan: Encounter for annual routine gynecological examination  Screening for STD (sexually transmitted disease) - Plan: Cytology - PAP  Encounter for surveillance of contraceptive pills - OCP RF - Plan: Norgestimate-Ethinyl Estradiol Triphasic (TRI-LO-MARZIA) 0.18/0.215/0.25 MG-25 MCG tab  Meds ordered this encounter  Medications  . levonorgestrel-ethinyl estradiol (AVIANE) 0.1-20 MG-MCG tablet    Sig: Take 1 tablet by mouth daily.  Dispense:  84 tablet    Refill:  3    Order Specific Question:   Supervising Provider    Answer:   Nadara Mustard [[id number]]             GYN counsel adequate intake of calcium and vitamin D, diet and exercise, Gardasil     F/U  Return in about 1 year (around 07/29/2021).  Dietra Stokely B. Kayzen Kendzierski, PA-C 07/29/2020 9:01 AM

## 2020-07-29 ENCOUNTER — Other Ambulatory Visit: Payer: Self-pay

## 2020-07-29 ENCOUNTER — Other Ambulatory Visit (HOSPITAL_COMMUNITY)
Admission: RE | Admit: 2020-07-29 | Discharge: 2020-07-29 | Disposition: A | Discharge status: Home / Self Care | Payer: 59 | Source: Ambulatory Visit | Attending: Obstetrics and Gynecology | Admitting: Obstetrics and Gynecology

## 2020-07-29 ENCOUNTER — Ambulatory Visit (INDEPENDENT_AMBULATORY_CARE_PROVIDER_SITE_OTHER): Payer: 59 | Admitting: Obstetrics and Gynecology

## 2020-07-29 ENCOUNTER — Encounter: Payer: Self-pay | Admitting: Obstetrics and Gynecology

## 2020-07-29 VITALS — BP 90/70 | Ht 61.0 in | Wt 141.0 lb

## 2020-07-29 DIAGNOSIS — Z01419 Encounter for gynecological examination (general) (routine) without abnormal findings: Secondary | ICD-10-CM | POA: Diagnosis not present

## 2020-07-29 DIAGNOSIS — Z113 Encounter for screening for infections with a predominantly sexual mode of transmission: Secondary | ICD-10-CM

## 2020-07-29 DIAGNOSIS — Z3041 Encounter for surveillance of contraceptive pills: Secondary | ICD-10-CM

## 2020-07-29 MED ORDER — LEVONORGESTREL-ETHINYL ESTRAD 0.1-20 MG-MCG PO TABS: 1.0000 | ORAL_TABLET | Freq: Every day | ORAL | 3 refills | Status: DC

## 2020-07-30 LAB — CERVICOVAGINAL ANCILLARY ONLY
Chlamydia: NEGATIVE
Comment: NEGATIVE
Comment: NORMAL
Neisseria Gonorrhea: NEGATIVE

## 2020-08-30 ENCOUNTER — Encounter: Payer: Self-pay | Admitting: Obstetrics and Gynecology

## 2020-09-10 ENCOUNTER — Inpatient Hospital Stay: Payer: 59 | Attending: Oncology

## 2020-09-10 ENCOUNTER — Other Ambulatory Visit: Payer: Self-pay

## 2020-09-10 DIAGNOSIS — D709 Neutropenia, unspecified: Secondary | ICD-10-CM | POA: Insufficient documentation

## 2020-09-10 DIAGNOSIS — E538 Deficiency of other specified B group vitamins: Secondary | ICD-10-CM | POA: Insufficient documentation

## 2020-09-10 LAB — CBC WITH DIFFERENTIAL/PLATELET
Abs Immature Granulocytes: 0 10*3/uL (ref 0.00–0.07)
Basophils Absolute: 0 10*3/uL (ref 0.0–0.1)
Basophils Relative: 1 %
Eosinophils Absolute: 0 10*3/uL (ref 0.0–0.5)
Eosinophils Relative: 1 %
HCT: 40.1 % (ref 36.0–46.0)
Hemoglobin: 13.2 g/dL (ref 12.0–15.0)
Immature Granulocytes: 0 %
Lymphocytes Relative: 51 %
Lymphs Abs: 1.1 10*3/uL (ref 0.7–4.0)
MCH: 27.7 pg (ref 26.0–34.0)
MCHC: 32.9 g/dL (ref 30.0–36.0)
MCV: 84.1 fL (ref 80.0–100.0)
Monocytes Absolute: 0.2 10*3/uL (ref 0.1–1.0)
Monocytes Relative: 12 %
Neutro Abs: 0.7 10*3/uL — ABNORMAL LOW (ref 1.7–7.7)
Neutrophils Relative %: 35 %
Platelets: 225 10*3/uL (ref 150–400)
RBC: 4.77 MIL/uL (ref 3.87–5.11)
RDW: 11.9 % (ref 11.5–15.5)
WBC: 2.1 10*3/uL — ABNORMAL LOW (ref 4.0–10.5)
nRBC: 0 % (ref 0.0–0.2)

## 2020-09-10 LAB — VITAMIN B12: Vitamin B-12: 278 pg/mL (ref 180–914)

## 2020-09-14 ENCOUNTER — Inpatient Hospital Stay (HOSPITAL_BASED_OUTPATIENT_CLINIC_OR_DEPARTMENT_OTHER): Payer: 59 | Admitting: Oncology

## 2020-09-14 ENCOUNTER — Encounter: Payer: Self-pay | Admitting: Oncology

## 2020-09-14 DIAGNOSIS — E538 Deficiency of other specified B group vitamins: Secondary | ICD-10-CM

## 2020-09-14 DIAGNOSIS — D709 Neutropenia, unspecified: Secondary | ICD-10-CM

## 2020-09-14 NOTE — Progress Notes (Signed)
I connected with Connie Stein on 09/14/20 at  2:15 PM EST by video enabled telemedicine visit and verified that I am speaking with the correct person using two identifiers.   I discussed the limitations, risks, security and privacy concerns of performing an evaluation and management service by telemedicine and the availability of in-person appointments. I also discussed with the patient that there may be a patient responsible charge related to this service. The patient expressed understanding and agreed to proceed.  Other persons participating in the visit and their role in the encounter:  none  Patient's location:  home Provider's location:  work  Stage manager Complaint: Routine follow-up of neutropenia  History of present illness: patient is a 24 year old African-American female with no significant past medical history referred for neutropenia.CBC with differential on 12/26/2019 for white count of 2.1, H&H of 12.5/37.9 with a platelet count of 201.  Differential showed neutropenia with an ANC of 700.  Repeat count on 01/14/2020 showed an ANC of 1.1 again with a normal hemoglobin and normal platelet count.  She reports feeling well overall.  Appetite and weight have remained stable.  She denies any unintentional weight loss.  Denies any recurrent infections.  Denies any joint pain or joint swelling.  Denies any skin rash.  Denies any over-the-counter medications or herbal supplements. Recent blood work revealed a low B12 level of 210.  HIV and hepatitis testing was negative.  Interval history: Patient reports doing well and denies any complaints at this time. Denies any recurrent infections or hospitalizations.   Review of Systems  Constitutional: Negative for chills, fever, malaise/fatigue and weight loss.  HENT: Negative for congestion, ear discharge and nosebleeds.   Eyes: Negative for blurred vision.  Respiratory: Negative for cough, hemoptysis, sputum production, shortness of breath and wheezing.    Cardiovascular: Negative for chest pain, palpitations, orthopnea and claudication.  Gastrointestinal: Negative for abdominal pain, blood in stool, constipation, diarrhea, heartburn, melena, nausea and vomiting.  Genitourinary: Negative for dysuria, flank pain, frequency, hematuria and urgency.  Musculoskeletal: Negative for back pain, joint pain and myalgias.  Skin: Negative for rash.  Neurological: Negative for dizziness, tingling, focal weakness, seizures, weakness and headaches.  Endo/Heme/Allergies: Does not bruise/bleed easily.  Psychiatric/Behavioral: Negative for depression and suicidal ideas. The patient does not have insomnia.     Allergies  Allergen Reactions  . Amoxicillin Hives and Other (See Comments)    Past Medical History:  Diagnosis Date  . BV (bacterial vaginosis)   . Healthy adult on routine physical examination 12/26/2019  . Neck swelling 03/06/2020  . Neutropenia (HCC) 03/06/2020    Past Surgical History:  Procedure Laterality Date  . THERAPEUTIC ABORTION  2019  . TONSILLECTOMY    . TOOTH EXTRACTION      Social History   Socioeconomic History  . Marital status: Single    Spouse name: Not on file  . Number of children: Not on file  . Years of education: Not on file  . Highest education level: Not on file  Occupational History  . Not on file  Tobacco Use  . Smoking status: Never Smoker  . Smokeless tobacco: Never Used  Vaping Use  . Vaping Use: Never used  Substance and Sexual Activity  . Alcohol use: Yes    Comment: On occasion  . Drug use: Never  . Sexual activity: Yes    Birth control/protection: Pill  Other Topics Concern  . Not on file  Social History Narrative  . Not on file   Social  Determinants of Health   Financial Resource Strain: Not on file  Food Insecurity: Not on file  Transportation Needs: Not on file  Physical Activity: Not on file  Stress: Not on file  Social Connections: Not on file  Intimate Partner Violence: Not on  file    Family History  Problem Relation Age of Onset  . Diabetes Mother   . Lymphoma Paternal Grandmother      Current Outpatient Medications:  .  levonorgestrel-ethinyl estradiol (AVIANE) 0.1-20 MG-MCG tablet, Take 1 tablet by mouth daily., Disp: 84 tablet, Rfl: 3  No results found.  No images are attached to the encounter.   CMP Latest Ref Rng & Units 12/26/2019  Glucose 70 - 99 mg/dL 99  BUN 6 - 23 mg/dL 7  Creatinine 8.03 - 2.12 mg/dL 2.48  Sodium 250 - 037 mEq/L 138  Potassium 3.5 - 5.1 mEq/L 4.0  Chloride 96 - 112 mEq/L 104  CO2 19 - 32 mEq/L 30  Calcium 8.4 - 10.5 mg/dL 9.3  Total Protein 6.0 - 8.3 g/dL 7.3  Total Bilirubin 0.2 - 1.2 mg/dL 0.2  Alkaline Phos 39 - 117 U/L 54  AST 0 - 37 U/L 12  ALT 0 - 35 U/L 10   CBC Latest Ref Rng & Units 09/10/2020  WBC 4.0 - 10.5 K/uL 2.1(L)  Hemoglobin 12.0 - 15.0 g/dL 04.8  Hematocrit 88.9 - 46.0 % 40.1  Platelets 150 - 400 K/uL 225     Observation/objective: Appears in no acute distress over video visit today. 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  Back in April again her ANC was 0.7 and had improved to 1.9 in May 2021.  I do not have any prior counts for comparison.  She was found to have a low B12 level on her initial evaluation at 210 which has mildly improved to 278.  I recommend that she should continue taking B12 1000 mcg daily but it is unlikely that it is truly connected to her neutropenia.  Follow-up instructions: Repeat CBC with differential in 3 months in 6 months.  See Dr. Smith Robert in 6 months for in person visit  I discussed the assessment and treatment plan with the patient. The patient was provided an opportunity to ask questions and all were answered. The patient agreed with the plan and demonstrated an understanding of the  instructions.   The patient was advised to call back or seek an in-person evaluation if the symptoms worsen or if the condition fails to improve as anticipated.   Visit Diagnosis: 1. Chronic neutropenia (HCC)   2. B12 deficiency     Dr. Owens Shark, MD, MPH [facility] Tel- [phone] 09/14/2020 10:40 AM

## 2020-09-14 NOTE — Progress Notes (Signed)
She is having no issues, just wants to know what causes her low counts.

## 2020-11-11 ENCOUNTER — Telehealth: Payer: Self-pay

## 2020-11-11 NOTE — Telephone Encounter (Signed)
Pt wants to get immunization record sent through MyChart

## 2020-11-12 ENCOUNTER — Encounter: Payer: Self-pay | Admitting: *Deleted

## 2020-11-25 ENCOUNTER — Telehealth: Payer: Self-pay | Admitting: Nurse Practitioner

## 2020-11-25 NOTE — Telephone Encounter (Signed)
Pt called she is a former [facility] patient. She needs to get a Tdap and have a lab drawn for TB  Pt didn't want to est with new provider at this time

## 2020-11-26 NOTE — Telephone Encounter (Signed)
Called and notified patient that yes we can give her a copy of immunization record but without establishing care with a provider in the office we could not place orders for labs or immunizations. Advised patient we would glad to schedule with new provider she refused at this time, so I advised her she could check with the health dept and if she decided at later date to schedule to please call the office.

## 2021-03-15 ENCOUNTER — Inpatient Hospital Stay: Payer: 59

## 2021-03-15 ENCOUNTER — Inpatient Hospital Stay: Payer: 59 | Admitting: Oncology

## 2021-04-09 ENCOUNTER — Other Ambulatory Visit: Payer: 59

## 2021-04-09 ENCOUNTER — Ambulatory Visit: Payer: 59 | Admitting: Oncology

## 2021-04-20 ENCOUNTER — Ambulatory Visit: Payer: 59 | Admitting: Oncology

## 2021-04-20 ENCOUNTER — Other Ambulatory Visit: Payer: 59

## 2021-04-26 ENCOUNTER — Encounter: Payer: Self-pay | Admitting: Nurse Practitioner

## 2021-04-26 ENCOUNTER — Inpatient Hospital Stay (HOSPITAL_BASED_OUTPATIENT_CLINIC_OR_DEPARTMENT_OTHER): Payer: 59 | Admitting: Nurse Practitioner

## 2021-04-26 ENCOUNTER — Inpatient Hospital Stay: Payer: 59 | Attending: Oncology

## 2021-04-26 VITALS — BP 129/85 | HR 77 | Temp 98.1°F | Resp 16 | Wt 138.0 lb

## 2021-04-26 DIAGNOSIS — E538 Deficiency of other specified B group vitamins: Secondary | ICD-10-CM | POA: Insufficient documentation

## 2021-04-26 DIAGNOSIS — D709 Neutropenia, unspecified: Secondary | ICD-10-CM | POA: Diagnosis present

## 2021-04-26 LAB — VITAMIN B12: Vitamin B-12: 223 pg/mL (ref 180–914)

## 2021-04-26 LAB — CBC WITH DIFFERENTIAL/PLATELET
Abs Immature Granulocytes: 0 10*3/uL (ref 0.00–0.07)
Basophils Absolute: 0 10*3/uL (ref 0.0–0.1)
Basophils Relative: 1 %
Eosinophils Absolute: 0 10*3/uL (ref 0.0–0.5)
Eosinophils Relative: 1 %
HCT: 40.1 % (ref 36.0–46.0)
Hemoglobin: 13.1 g/dL (ref 12.0–15.0)
Immature Granulocytes: 0 %
Lymphocytes Relative: 56 %
Lymphs Abs: 1.8 10*3/uL (ref 0.7–4.0)
MCH: 27.9 pg (ref 26.0–34.0)
MCHC: 32.7 g/dL (ref 30.0–36.0)
MCV: 85.5 fL (ref 80.0–100.0)
Monocytes Absolute: 0.3 10*3/uL (ref 0.1–1.0)
Monocytes Relative: 10 %
Neutro Abs: 1 10*3/uL — ABNORMAL LOW (ref 1.7–7.7)
Neutrophils Relative %: 32 %
Platelets: 206 10*3/uL (ref 150–400)
RBC: 4.69 MIL/uL (ref 3.87–5.11)
RDW: 12.3 % (ref 11.5–15.5)
WBC: 3.2 10*3/uL — ABNORMAL LOW (ref 4.0–10.5)
nRBC: 0 % (ref 0.0–0.2)

## 2021-04-26 NOTE — Progress Notes (Signed)
Patient here for follow up.  No complaints at this time. 

## 2021-04-26 NOTE — Progress Notes (Signed)
Hematology/Oncology Consult Note [facility] [phone] [phone]  Patient Care Team: Marval Regal, NP as PCP - General (Nurse Practitioner)   Name of the patient: Connie Stein  [id number]  09-Jun-1996   Chief Complaint-neutropenia   Referring physician-Kimberly Jerelene Redden, NP  Date of visit: 04/26/21  History of presenting illness-patient is a 25 year old African-American female with no significant past medical history referred for neutropenia.CBC with differential on 12/26/2019 for white count of 2.1, H&H of 12.5/37.9 with a platelet count of 201.  Differential showed neutropenia with an [employer] of 700.  Repeat count on 01/14/2020 showed an ANC of 1.1 again with a normal hemoglobin and normal platelet count.  She reports feeling well overall.  Appetite and weight have remained stable.  She denies any unintentional weight loss.  Denies any recurrent infections.  Denies any joint pain or joint swelling.  Denies any skin rash.  Denies any over-the-counter medications or herbal supplements. Recent blood work revealed a low B12 level of 210.  HIV and hepatitis testing was negative.  Interval History: Patient returns to clinic for labs, further evaluation of neutropenia. No interval fevers, chills, recurrent infections, or hospitalizations. She feels well. No unintentional weight loss.   ECOG PS- 0  Pain scale- 0   Review of systems- Review of Systems  Constitutional:  Negative for chills, fever, malaise/fatigue and weight loss.  HENT:  Negative for congestion, ear discharge and nosebleeds.   Eyes:  Negative for blurred vision.  Respiratory:  Negative for cough, hemoptysis, sputum production, shortness of breath and wheezing.   Cardiovascular:  Negative for chest pain, palpitations, orthopnea and claudication.  Gastrointestinal:  Negative for abdominal pain, blood in stool, constipation, diarrhea, heartburn, melena, nausea and vomiting.   Genitourinary:  Negative for dysuria, flank pain, frequency, hematuria and urgency.  Musculoskeletal:  Negative for back pain, joint pain and myalgias.  Skin:  Negative for rash.  Neurological:  Negative for dizziness, tingling, focal weakness, seizures, weakness and headaches.  Endo/Heme/Allergies:  Does not bruise/bleed easily.  Psychiatric/Behavioral:  Negative for depression and suicidal ideas. The patient does not have insomnia.    Allergies  Allergen Reactions   Amoxicillin Hives and Other (See Comments)    Patient Active Problem List   Diagnosis Date Noted   B12 deficiency 03/09/2020   Low vitamin D level 03/09/2020   Neck swelling 03/06/2020   Neutropenia ([place]) 03/06/2020   Healthy adult on routine physical examination 12/26/2019     Past Medical History:  Diagnosis Date   BV (bacterial vaginosis)    Healthy adult on routine physical examination 12/26/2019   Neck swelling 03/06/2020   Neutropenia (HCC) 03/06/2020     Past Surgical History:  Procedure Laterality Date   THERAPEUTIC ABORTION  2019   TONSILLECTOMY     TOOTH EXTRACTION      Social History   Socioeconomic History   Marital status: Single    Spouse name: Not on file   Number of children: Not on file   Years of education: Not on file   Highest education level: Not on file  Occupational History   Not on file  Tobacco Use   Smoking status: Never   Smokeless tobacco: Never  Vaping Use   Vaping Use: Never used  Substance and Sexual Activity   Alcohol use: Yes    Comment: On occasion   Drug use: Never   Sexual activity: Yes    Birth control/protection: Pill  Other Topics Concern   Not on  file  Social History Narrative   Not on file   Social Determinants of Health   Financial Resource Strain: Not on file  Food Insecurity: Not on file  Transportation Needs: Not on file  Physical Activity: Not on file  Stress: Not on file  Social Connections: Not on file  Intimate Partner Violence: Not on  file     Family History  Problem Relation Age of Onset   Diabetes Mother    Lymphoma Paternal Grandmother    Vitiligo Father      Current Outpatient Medications:    levonorgestrel-ethinyl estradiol (AVIANE) 0.1-20 MG-MCG tablet, Take 1 tablet by mouth daily., Disp: 84 tablet, Rfl: 3   Physical exam:  Vitals:   04/26/21 1109  BP: 129/85  Pulse: 77  Resp: 16  Temp: 98.1 F (36.7 C)  TempSrc: Tympanic  Weight: 138 lb (62.6 kg)   Physical Exam Constitutional:      General: She is not in acute distress. Cardiovascular:     Rate and Rhythm: Normal rate and regular rhythm.     Heart sounds: Normal heart sounds.  Pulmonary:     Effort: Pulmonary effort is normal.     Breath sounds: Normal breath sounds.  Abdominal:     General: Bowel sounds are normal.     Palpations: Abdomen is soft.  Lymphadenopathy:     Comments: No palpable cervical, supraclavicular, axillary or inguinal adenopathy   Skin:    General: Skin is warm and dry.  Neurological:     Mental Status: She is alert and oriented to person, place, and time.     CMP Latest Ref Rng & Units 12/26/2019  Glucose 70 - 99 mg/dL 99  BUN 6 - 23 mg/dL 7  Creatinine 0.40 - 1.20 mg/dL 0.75  Sodium 135 - 145 mEq/L 138  Potassium 3.5 - 5.1 mEq/L 4.0  Chloride 96 - 112 mEq/L 104  CO2 19 - 32 mEq/L 30  Calcium 8.4 - 10.5 mg/dL 9.3  Total Protein 6.0 - 8.3 g/dL 7.3  Total Bilirubin 0.2 - 1.2 mg/dL 0.2  Alkaline Phos 39 - 117 U/L 54  AST 0 - 37 U/L 12  ALT 0 - 35 U/L 10   CBC EXTENDED Latest Ref Rng & Units 09/10/2020 01/14/2020 12/26/2019  WBC 4.0 - 10.5 K/uL 2.1(L) 2.8(L) 2.1 Repeated and verified X2.(L)  RBC 3.87 - 5.11 MIL/uL 4.77 4.46 4.52  HGB 12.0 - 15.0 g/dL 13.2 12.4 12.5  HCT 36.0 - 46.0 % 40.1 37.5 37.9  PLT 150 - 400 K/uL 225 232.0 201.0  NEUTROABS 1.7 - 7.7 K/uL 0.7(L) 1.1(L) 0.7(L)  LYMPHSABS 0.7 - 4.0 K/uL 1.1 1.4 1.0    Assessment and plan- Patient is a 25 y.o. female with history of isolated  neutropenia, likely benign ethnic neutropenia.   Clinically asymptomatic and no recurrent infections. May also be related to b12 deficiency. She has started a multivitamin and we discussed starting otc b12 supplement daily. Given isolated neutropenia and no concerning findings on physical exam, I wouldn't recommend a bone marrow biopsy at this time. Plan to repeat labs in six months and follow up with Dr. Janese Banks at that time.   Thank you for this kind referral and the opportunity to participate in the care of this patient  Visit Diagnosis 1. Chronic neutropenia ([place])    Beckey Rutter, DNP, AGNP-C [place] [facility] [phone] (clinic) 04/26/2021 10:42 AM

## 2021-04-27 NOTE — Progress Notes (Signed)
Patient stated she moved out of town and is unable to start injections, refuses home injections. She has been taking multivitmain with B12 and stated she will pickup OTC Vitamin B 12 to take additionally as advised by Consuello Masse, NP. She needs to know how much B12 OTC to take?

## 2021-04-27 NOTE — Progress Notes (Signed)
Called patient and informed her to take daily. Left voicemail.

## 2021-04-27 NOTE — Progress Notes (Signed)
Called patient and left voicemail, no answer

## 2021-08-01 NOTE — Progress Notes (Signed)
PCP:  Theadore Nan, NP   Chief Complaint  Patient presents with   Gynecologic Exam    No concerns     HPI:      Connie Stein is a 25 y.o. whose LMP was Patient's last menstrual period was 07/07/2021 (approximate)., presents today for her annual examination.  Her menses are regular every 28-30 days, lasting 4 days.  Dysmenorrhea minimal. She does not have intermenstrual bleeding.  Sex activity: single partner, contraception - OCP (estrogen/progesterone) Last Pap: 05/02/19 Results were normal Hx of STDs: none; wants STD testing  There is no FH of breast cancer. There is no FH of ovarian cancer. The patient does do self-breast exams.  Tobacco use: The patient denies current or previous tobacco use. Alcohol use: social drinker No drug use.  Exercise: moderately active  She does not get adequate calcium and Vitamin D in her diet.  Gardasil declined    Past Medical History:  Diagnosis Date   BV (bacterial vaginosis)    Healthy adult on routine physical examination 12/26/2019   Neck swelling 03/06/2020   Neutropenia (HCC) 03/06/2020    Past Surgical History:  Procedure Laterality Date   TONSILLECTOMY     TOOTH EXTRACTION      Family History  Problem Relation Age of Onset   Diabetes Mother    Lymphoma Paternal Grandmother    Vitiligo Father     Social History   Socioeconomic History   Marital status: Single    Spouse name: Not on file   Number of children: Not on file   Years of education: Not on file   Highest education level: Not on file  Occupational History   Not on file  Tobacco Use   Smoking status: Never   Smokeless tobacco: Never  Vaping Use   Vaping Use: Never used  Substance and Sexual Activity   Alcohol use: Yes    Comment: On occasion   Drug use: Never   Sexual activity: Yes    Birth control/protection: Pill  Other Topics Concern   Not on file  Social History Narrative   Not on file   Social Determinants of Health   Financial  Resource Strain: Not on file  Food Insecurity: Not on file  Transportation Needs: Not on file  Physical Activity: Not on file  Stress: Not on file  Social Connections: Not on file  Intimate Partner Violence: Not on file    Outpatient Medications Prior to Visit  Medication Sig Dispense Refill   levonorgestrel-ethinyl estradiol (AVIANE) 0.1-20 MG-MCG tablet Take 1 tablet by mouth daily. 84 tablet 3   No facility-administered medications prior to visit.      ROS:  Review of Systems  Constitutional:  Negative for fatigue, fever and unexpected weight change.  Respiratory:  Negative for cough, shortness of breath and wheezing.   Cardiovascular:  Negative for chest pain, palpitations and leg swelling.  Gastrointestinal:  Negative for blood in stool, constipation, diarrhea, nausea and vomiting.  Endocrine: Negative for cold intolerance, heat intolerance and polyuria.  Genitourinary:  Negative for dyspareunia, dysuria, flank pain, frequency, genital sores, hematuria, menstrual problem, pelvic pain, urgency, vaginal bleeding, vaginal discharge and vaginal pain.  Musculoskeletal:  Negative for back pain, joint swelling and myalgias.  Skin:  Negative for rash.  Neurological:  Negative for dizziness, syncope, light-headedness, numbness and headaches.  Hematological:  Negative for adenopathy.  Psychiatric/Behavioral:  Negative for agitation, confusion, sleep disturbance and suicidal ideas. The patient is not nervous/anxious.  BREAST: No symptoms   Objective: BP 100/70   Ht 5\' 1"  (1.549 m)   Wt 141 lb (64 kg)   LMP 07/07/2021 (Approximate)   BMI 26.64 kg/m    Physical Exam Constitutional:      Appearance: She is well-developed.  Genitourinary:     Vulva normal.     Right Labia: No rash, tenderness or lesions.    Left Labia: No tenderness, lesions or rash.    No vaginal discharge, erythema or tenderness.      Right Adnexa: not tender and no mass present.    Left Adnexa: not  tender and no mass present.    No cervical motion tenderness, friability or polyp.     Uterus is not enlarged or tender.  Breasts:    Right: No mass, nipple discharge, skin change or tenderness.     Left: No mass, nipple discharge, skin change or tenderness.  Neck:     Thyroid: No thyromegaly.  Cardiovascular:     Rate and Rhythm: Normal rate and regular rhythm.     Heart sounds: Normal heart sounds. No murmur heard. Pulmonary:     Effort: Pulmonary effort is normal.     Breath sounds: Normal breath sounds.  Abdominal:     Palpations: Abdomen is soft.     Tenderness: There is no abdominal tenderness. There is no guarding or rebound.  Musculoskeletal:        General: Normal range of motion.     Cervical back: Normal range of motion.  Lymphadenopathy:     Cervical: No cervical adenopathy.  Neurological:     General: No focal deficit present.     Mental Status: She is alert and oriented to person, place, and time.     Cranial Nerves: No cranial nerve deficit.  Skin:    General: Skin is warm and dry.  Psychiatric:        Mood and Affect: Mood normal.        Behavior: Behavior normal.        Thought Content: Thought content normal.        Judgment: Judgment normal.  Vitals reviewed.    Assessment/Plan: Encounter for annual routine gynecological examination  Screening for STD (sexually transmitted disease) - Plan: Cervicovaginal ancillary only, HIV Antibody (routine testing w rflx), RPR  Encounter for surveillance of contraceptive pills - OCP RF - Plan: Norgestimate-Ethinyl Estradiol Triphasic (TRI-LO-MARZIA) 0.18/0.215/0.25 MG-25 MCG tab; OCP RF  Meds ordered this encounter  Medications   levonorgestrel-ethinyl estradiol (AVIANE) 0.1-20 MG-MCG tablet    Sig: Take 1 tablet by mouth daily.    Dispense:  84 tablet    Refill:  3    Order Specific Question:   Supervising Provider    Answer:   Gae Dry [id number]              GYN counsel adequate intake of calcium and  vitamin D, diet and exercise, Gardasil     F/U  Return in about 1 year (around 08/02/2022).  Vivian Neuwirth B. Lupie Sawa, PA-C 08/02/2021 10:50 AM

## 2021-08-02 ENCOUNTER — Ambulatory Visit (INDEPENDENT_AMBULATORY_CARE_PROVIDER_SITE_OTHER): Payer: 59 | Admitting: Obstetrics and Gynecology

## 2021-08-02 ENCOUNTER — Encounter: Payer: Self-pay | Admitting: Obstetrics and Gynecology

## 2021-08-02 ENCOUNTER — Other Ambulatory Visit (HOSPITAL_COMMUNITY)
Admission: RE | Admit: 2021-08-02 | Discharge: 2021-08-02 | Disposition: A | Discharge status: Home / Self Care | Payer: 59 | Source: Ambulatory Visit | Attending: Obstetrics and Gynecology | Admitting: Obstetrics and Gynecology

## 2021-08-02 ENCOUNTER — Other Ambulatory Visit: Payer: Self-pay

## 2021-08-02 VITALS — BP 100/70 | Ht 61.0 in | Wt 141.0 lb

## 2021-08-02 DIAGNOSIS — Z01419 Encounter for gynecological examination (general) (routine) without abnormal findings: Secondary | ICD-10-CM | POA: Diagnosis not present

## 2021-08-02 DIAGNOSIS — Z113 Encounter for screening for infections with a predominantly sexual mode of transmission: Secondary | ICD-10-CM | POA: Diagnosis present

## 2021-08-02 DIAGNOSIS — Z3041 Encounter for surveillance of contraceptive pills: Secondary | ICD-10-CM | POA: Diagnosis not present

## 2021-08-02 MED ORDER — LEVONORGESTREL-ETHINYL ESTRAD 0.1-20 MG-MCG PO TABS: 1.0000 | ORAL_TABLET | Freq: Every day | ORAL | 3 refills | Status: DC

## 2021-08-02 NOTE — Patient Instructions (Signed)
I value your feedback and you entrusting us with your care. If you get a [facility] patient survey, I would appreciate you taking the time to let us know about your experience today. Thank you! ? ? ?

## 2021-08-03 LAB — CERVICOVAGINAL ANCILLARY ONLY
Chlamydia: NEGATIVE
Comment: NEGATIVE
Comment: NORMAL
Neisseria Gonorrhea: NEGATIVE

## 2021-08-03 LAB — RPR: RPR Ser Ql: NONREACTIVE

## 2021-08-03 LAB — HIV ANTIBODY (ROUTINE TESTING W REFLEX): HIV Screen 4th Generation wRfx: NONREACTIVE

## 2021-10-19 ENCOUNTER — Other Ambulatory Visit: Payer: Self-pay | Admitting: *Deleted

## 2021-10-19 DIAGNOSIS — D709 Neutropenia, unspecified: Secondary | ICD-10-CM

## 2021-10-25 ENCOUNTER — Other Ambulatory Visit: Payer: Self-pay

## 2021-10-25 ENCOUNTER — Inpatient Hospital Stay (HOSPITAL_BASED_OUTPATIENT_CLINIC_OR_DEPARTMENT_OTHER): Payer: 59 | Admitting: Oncology

## 2021-10-25 ENCOUNTER — Encounter: Payer: Self-pay | Admitting: Oncology

## 2021-10-25 ENCOUNTER — Inpatient Hospital Stay: Payer: 59 | Attending: Oncology

## 2021-10-25 VITALS — BP 117/82 | HR 84 | Temp 96.8°F | Resp 16 | Ht 61.0 in | Wt 145.3 lb

## 2021-10-25 DIAGNOSIS — D708 Other neutropenia: Secondary | ICD-10-CM

## 2021-10-25 DIAGNOSIS — Z8349 Family history of other endocrine, nutritional and metabolic diseases: Secondary | ICD-10-CM | POA: Diagnosis not present

## 2021-10-25 DIAGNOSIS — Z880 Allergy status to penicillin: Secondary | ICD-10-CM | POA: Diagnosis not present

## 2021-10-25 DIAGNOSIS — Z833 Family history of diabetes mellitus: Secondary | ICD-10-CM | POA: Diagnosis not present

## 2021-10-25 DIAGNOSIS — D709 Neutropenia, unspecified: Secondary | ICD-10-CM | POA: Diagnosis present

## 2021-10-25 DIAGNOSIS — Z793 Long term (current) use of hormonal contraceptives: Secondary | ICD-10-CM | POA: Insufficient documentation

## 2021-10-25 LAB — CBC WITH DIFFERENTIAL/PLATELET
Abs Immature Granulocytes: 0 10*3/uL (ref 0.00–0.07)
Basophils Absolute: 0 10*3/uL (ref 0.0–0.1)
Basophils Relative: 1 %
Eosinophils Absolute: 0 10*3/uL (ref 0.0–0.5)
Eosinophils Relative: 1 %
HCT: 41.5 % (ref 36.0–46.0)
Hemoglobin: 13.4 g/dL (ref 12.0–15.0)
Immature Granulocytes: 0 %
Lymphocytes Relative: 54 %
Lymphs Abs: 1.6 10*3/uL (ref 0.7–4.0)
MCH: 28 pg (ref 26.0–34.0)
MCHC: 32.3 g/dL (ref 30.0–36.0)
MCV: 86.6 fL (ref 80.0–100.0)
Monocytes Absolute: 0.3 10*3/uL (ref 0.1–1.0)
Monocytes Relative: 9 %
Neutro Abs: 1.1 10*3/uL — ABNORMAL LOW (ref 1.7–7.7)
Neutrophils Relative %: 35 %
Platelets: 239 10*3/uL (ref 150–400)
RBC: 4.79 MIL/uL (ref 3.87–5.11)
RDW: 12.4 % (ref 11.5–15.5)
WBC: 3 10*3/uL — ABNORMAL LOW (ref 4.0–10.5)
nRBC: 0 % (ref 0.0–0.2)

## 2021-10-25 LAB — VITAMIN B12: Vitamin B-12: 811 pg/mL (ref 180–914)

## 2021-10-25 NOTE — Progress Notes (Signed)
Hematology/Oncology Consult note [facility]  [phone] [phone]  Patient Care Team: Marval Regal, NP as PCP - General (Nurse Practitioner)   Name of the patient: Connie Stein  [id number]  1996-03-11   Date of visit: 10/25/21  Diagnosis- benign chronic neutropenia  Chief complaint/ Reason for visit-routine follow-up of neutropenia  Heme/Onc history: patient is a 26 year old African-American female with no significant past medical history referred for neutropenia.CBC with differential on 12/26/2019 for white count of 2.1, H&H of 12.5/37.9 with a platelet count of 201.  Differential showed neutropenia with an [place] of 700.  Repeat count on 01/14/2020 showed an ANC of 1.1 again with a normal hemoglobin and normal platelet count.  She reports feeling well overall.  Appetite and weight have remained stable.  She denies any unintentional weight loss.  Denies any recurrent infections.  Denies any joint pain or joint swelling.  Denies any skin rash.  Denies any over-the-counter medications or herbal supplements. Recent blood work revealed a low B12 level of 210.  HIV and hepatitis testing was negative.  Interval history-patient is doing well and denies any specific complaints at this time.  He does not have any recurrent infections or hospitalizations.  ECOG PS- 0 Pain scale- 0   Review of systems- Review of Systems  Constitutional:  Negative for chills, fever, malaise/fatigue and weight loss.  HENT:  Negative for congestion, ear discharge and nosebleeds.   Eyes:  Negative for blurred vision.  Respiratory:  Negative for cough, hemoptysis, sputum production, shortness of breath and wheezing.   Cardiovascular:  Negative for chest pain, palpitations, orthopnea and claudication.  Gastrointestinal:  Negative for abdominal pain, blood in stool, constipation, diarrhea, heartburn, melena, nausea and vomiting.  Genitourinary:  Negative for dysuria,  flank pain, frequency, hematuria and urgency.  Musculoskeletal:  Negative for back pain, joint pain and myalgias.  Skin:  Negative for rash.  Neurological:  Negative for dizziness, tingling, focal weakness, seizures, weakness and headaches.  Endo/Heme/Allergies:  Does not bruise/bleed easily.  Psychiatric/Behavioral:  Negative for depression and suicidal ideas. The patient does not have insomnia.       Allergies  Allergen Reactions   Amoxicillin Hives and Other (See Comments)     Past Medical History:  Diagnosis Date   BV (bacterial vaginosis)    Healthy adult on routine physical examination 12/26/2019   Neck swelling 03/06/2020   Neutropenia (HCC) 03/06/2020     Past Surgical History:  Procedure Laterality Date   TONSILLECTOMY     TOOTH EXTRACTION      Social History   Socioeconomic History   Marital status: Single    Spouse name: Not on file   Number of children: Not on file   Years of education: Not on file   Highest education level: Not on file  Occupational History   Not on file  Tobacco Use   Smoking status: Never   Smokeless tobacco: Never  Vaping Use   Vaping Use: Never used  Substance and Sexual Activity   Alcohol use: Yes    Comment: On occasion   Drug use: Never   Sexual activity: Yes    Birth control/protection: Pill  Other Topics Concern   Not on file  Social History Narrative   Not on file   Social Determinants of Health   Financial Resource Strain: Not on file  Food Insecurity: Not on file  Transportation Needs: Not on file  Physical Activity: Not on file  Stress: Not  on file  Social Connections: Not on file  Intimate Partner Violence: Not on file    Family History  Problem Relation Age of Onset   Diabetes Mother    Lymphoma Paternal Grandmother    Vitiligo Father      Current Outpatient Medications:    Cholecalciferol (VITAMIN D3) 1.25 MG (50000 UT) CAPS, Take by mouth., Disp: , Rfl:    Cyanocobalamin (B-12) 100 MCG TABS, Take by  mouth., Disp: , Rfl:    levonorgestrel-ethinyl estradiol (AVIANE) 0.1-20 MG-MCG tablet, Take 1 tablet by mouth daily., Disp: 84 tablet, Rfl: 3   Multiple Vitamins-Minerals (MULTIPLE VITAMINS/WOMENS PO), Take by mouth., Disp: , Rfl:   Physical exam:  Vitals:   10/25/21 0944  BP: 117/82  Pulse: 84  Resp: 16  Temp: (!) 96.8 F (36 C)  Weight: 145 lb 4.8 oz (65.9 kg)  Height: _0  (1.549 m)   Physical Exam Constitutional:      General: She is not in acute distress. Cardiovascular:     Rate and Rhythm: Normal rate and regular rhythm.     Heart sounds: Normal heart sounds.  Pulmonary:     Effort: Pulmonary effort is normal.     Breath sounds: Normal breath sounds.  Skin:    General: Skin is warm and dry.  Neurological:     Mental Status: She is alert and oriented to person, place, and time.     CMP Latest Ref Rng & Units 12/26/2019  Glucose 70 - 99 mg/dL 99  BUN 6 - 23 mg/dL 7  Creatinine 0.40 - 1.20 mg/dL 0.75  Sodium 135 - 145 mEq/L 138  Potassium 3.5 - 5.1 mEq/L 4.0  Chloride 96 - 112 mEq/L 104  CO2 19 - 32 mEq/L 30  Calcium 8.4 - 10.5 mg/dL 9.3  Total Protein 6.0 - 8.3 g/dL 7.3  Total Bilirubin 0.2 - 1.2 mg/dL 0.2  Alkaline Phos 39 - 117 U/L 54  AST 0 - 37 U/L 12  ALT 0 - 35 U/L 10   CBC Latest Ref Rng & Units 10/25/2021  WBC 4.0 - 10.5 K/uL 3.0(L)  Hemoglobin 12.0 - 15.0 g/dL 13.4  Hematocrit 36.0 - 46.0 % 41.5  Platelets 150 - 400 K/uL 239     Assessment and plan- Patient is a 26 y.o. female with history of chronic neutropenia likely benign  Patient has had a white count that has fluctuated between 2-3.5 over the last 2 years.  Today it is 3.  Her ANC today is 1.1.  Since April 2021 her [address] has been between 1.7-1.1.  Hemoglobin and platelets are normal.  She denies any recurrent infections.  I suspect we are dealing with benign chronic neutropenia which could be benign ethnic neutropenia as well.  Given her stability and waxing and waning of her neutrophil count  with no other cytopenias she does not require a bone marrow biopsy at this time.  Repeat CBC with differential in 6 months in 1 year and I will see her back in 1 year   Visit Diagnosis 1. Chronic benign neutropenia (HCC)      Dr. Randa Evens, MD, MPH [facility] [phone] 10/25/2021 1:06 PM

## 2022-04-19 ENCOUNTER — Telehealth: Payer: Self-pay | Admitting: Oncology

## 2022-04-19 NOTE — Telephone Encounter (Signed)
Patient called to cancel her lab appointment per radiation--called her to f/u and she declined to r/s at this time and will call back when able.

## 2022-04-19 NOTE — Telephone Encounter (Signed)
pt called in to cancel appt for 8/25 due to work obligation, will call back to r/s when she knows she can make it.Marland KitchenKJ

## 2022-04-22 ENCOUNTER — Other Ambulatory Visit: Payer: 59

## 2022-06-14 ENCOUNTER — Other Ambulatory Visit: Payer: Self-pay | Admitting: Obstetrics and Gynecology

## 2022-06-14 DIAGNOSIS — Z3041 Encounter for surveillance of contraceptive pills: Secondary | ICD-10-CM

## 2022-06-30 ENCOUNTER — Encounter: Payer: Self-pay | Admitting: Obstetrics and Gynecology

## 2022-06-30 DIAGNOSIS — Z3041 Encounter for surveillance of contraceptive pills: Secondary | ICD-10-CM

## 2022-06-30 MED ORDER — LEVONORGESTREL-ETHINYL ESTRAD 0.1-20 MG-MCG PO TABS: 1.0000 | ORAL_TABLET | Freq: Every day | ORAL | 0 refills | Status: DC

## 2022-08-02 ENCOUNTER — Encounter: Payer: Self-pay | Admitting: Obstetrics and Gynecology

## 2022-08-03 NOTE — Progress Notes (Unsigned)
PCP:  Theadore Nan, NP   No chief complaint on file.    HPI:      [name] is a 26 y.o. whose LMP was No LMP recorded., presents today for her annual examination.  Her menses are regular every 28-30 days, lasting 4 days.  Dysmenorrhea minimal. She does not have intermenstrual bleeding.  Sex activity: single partner, contraception - OCP (estrogen/progesterone) Last Pap: 05/02/19 Results were normal Hx of STDs: none; wants STD testing  There is no FH of breast cancer. There is no FH of ovarian cancer. The patient does do self-breast exams.  Tobacco use: The patient denies current or previous tobacco use. Alcohol use: social drinker No drug use.  Exercise: moderately active  She does not get adequate calcium and Vitamin D in her diet.  Gardasil declined    Past Medical History:  Diagnosis Date   BV (bacterial vaginosis)    Healthy adult on routine physical examination 12/26/2019   Neck swelling 03/06/2020   Neutropenia (HCC) 03/06/2020    Past Surgical History:  Procedure Laterality Date   TONSILLECTOMY     TOOTH EXTRACTION      Family History  Problem Relation Age of Onset   Diabetes Mother    Lymphoma Paternal Grandmother    Vitiligo Father     Social History   Socioeconomic History   Marital status: Single    Spouse name: Not on file   Number of children: Not on file   Years of education: Not on file   Highest education level: Not on file  Occupational History   Not on file  Tobacco Use   Smoking status: Never   Smokeless tobacco: Never  Vaping Use   Vaping Use: Never used  Substance and Sexual Activity   Alcohol use: Yes    Comment: On occasion   Drug use: Never   Sexual activity: Yes    Birth control/protection: Pill  Other Topics Concern   Not on file  Social History Narrative   Not on file   Social Determinants of Health   Financial Resource Strain: Not on file  Food Insecurity: Not on file  Transportation Needs: Not on file   Physical Activity: Not on file  Stress: Not on file  Social Connections: Not on file  Intimate Partner Violence: Not on file    Outpatient Medications Prior to Visit  Medication Sig Dispense Refill   Cholecalciferol (VITAMIN D3) 1.25 MG (50000 UT) CAPS Take by mouth.     Cyanocobalamin (B-12) 100 MCG TABS Take by mouth.     levonorgestrel-ethinyl estradiol (AVIANE) 0.1-20 MG-MCG tablet Take 1 tablet by mouth daily. 84 tablet 0   Multiple Vitamins-Minerals (MULTIPLE VITAMINS/WOMENS PO) Take by mouth.     No facility-administered medications prior to visit.      ROS:  Review of Systems  Constitutional:  Negative for fatigue, fever and unexpected weight change.  Respiratory:  Negative for cough, shortness of breath and wheezing.   Cardiovascular:  Negative for chest pain, palpitations and leg swelling.  Gastrointestinal:  Negative for blood in stool, constipation, diarrhea, nausea and vomiting.  Endocrine: Negative for cold intolerance, heat intolerance and polyuria.  Genitourinary:  Negative for dyspareunia, dysuria, flank pain, frequency, genital sores, hematuria, menstrual problem, pelvic pain, urgency, vaginal bleeding, vaginal discharge and vaginal pain.  Musculoskeletal:  Negative for back pain, joint swelling and myalgias.  Skin:  Negative for rash.  Neurological:  Negative for dizziness, syncope, light-headedness, numbness and headaches.  Hematological:  Negative for adenopathy.  Psychiatric/Behavioral:  Negative for agitation, confusion, sleep disturbance and suicidal ideas. The patient is not nervous/anxious.   BREAST: No symptoms   Objective: There were no vitals taken for this visit.   Physical Exam Constitutional:      Appearance: She is well-developed.  Genitourinary:     Vulva normal.     Right Labia: No rash, tenderness or lesions.    Left Labia: No tenderness, lesions or rash.    No vaginal discharge, erythema or tenderness.      Right Adnexa: not  tender and no mass present.    Left Adnexa: not tender and no mass present.    No cervical motion tenderness, friability or polyp.     Uterus is not enlarged or tender.  Breasts:    Right: No mass, nipple discharge, skin change or tenderness.     Left: No mass, nipple discharge, skin change or tenderness.  Neck:     Thyroid: No thyromegaly.  Cardiovascular:     Rate and Rhythm: Normal rate and regular rhythm.     Heart sounds: Normal heart sounds. No murmur heard. Pulmonary:     Effort: Pulmonary effort is normal.     Breath sounds: Normal breath sounds.  Abdominal:     Palpations: Abdomen is soft.     Tenderness: There is no abdominal tenderness. There is no guarding or rebound.  Musculoskeletal:        General: Normal range of motion.     Cervical back: Normal range of motion.  Lymphadenopathy:     Cervical: No cervical adenopathy.  Neurological:     General: No focal deficit present.     Mental Status: She is alert and oriented to person, place, and time.     Cranial Nerves: No cranial nerve deficit.  Skin:    General: Skin is warm and dry.  Psychiatric:        Mood and Affect: Mood normal.        Behavior: Behavior normal.        Thought Content: Thought content normal.        Judgment: Judgment normal.  Vitals reviewed.     Assessment/Plan: Encounter for annual routine gynecological examination  Screening for STD (sexually transmitted disease) - Plan: Cervicovaginal ancillary only, HIV Antibody (routine testing w rflx), RPR  Encounter for surveillance of contraceptive pills - OCP RF - Plan: Norgestimate-Ethinyl Estradiol Triphasic (TRI-LO-MARZIA) 0.18/0.215/0.25 MG-25 MCG tab; OCP RF  No orders of the defined types were placed in this encounter.             GYN counsel adequate intake of calcium and vitamin D, diet and exercise, Gardasil     F/U  No follow-ups on file.  Kolyn Rozario B. Corbyn Wildey, PA-C 08/03/2022 8:57 PM

## 2022-08-04 ENCOUNTER — Ambulatory Visit (INDEPENDENT_AMBULATORY_CARE_PROVIDER_SITE_OTHER): Payer: No Typology Code available for payment source | Admitting: Obstetrics and Gynecology

## 2022-08-04 ENCOUNTER — Other Ambulatory Visit (HOSPITAL_COMMUNITY)
Admission: RE | Admit: 2022-08-04 | Discharge: 2022-08-04 | Disposition: A | Discharge status: Home / Self Care | Payer: No Typology Code available for payment source | Source: Ambulatory Visit | Attending: Obstetrics and Gynecology | Admitting: Obstetrics and Gynecology

## 2022-08-04 ENCOUNTER — Encounter: Payer: Self-pay | Admitting: Obstetrics and Gynecology

## 2022-08-04 VITALS — BP 110/70 | Ht 61.0 in | Wt 148.0 lb

## 2022-08-04 DIAGNOSIS — Z3041 Encounter for surveillance of contraceptive pills: Secondary | ICD-10-CM | POA: Diagnosis not present

## 2022-08-04 DIAGNOSIS — Z01419 Encounter for gynecological examination (general) (routine) without abnormal findings: Secondary | ICD-10-CM

## 2022-08-04 DIAGNOSIS — Z113 Encounter for screening for infections with a predominantly sexual mode of transmission: Secondary | ICD-10-CM | POA: Insufficient documentation

## 2022-08-04 DIAGNOSIS — Z124 Encounter for screening for malignant neoplasm of cervix: Secondary | ICD-10-CM | POA: Diagnosis present

## 2022-08-04 MED ORDER — LEVONORGESTREL-ETHINYL ESTRAD 0.1-20 MG-MCG PO TABS: 1.0000 | ORAL_TABLET | Freq: Every day | ORAL | 3 refills | Status: DC

## 2022-08-04 NOTE — Patient Instructions (Signed)
I value your feedback and you entrusting us with your care. If you get a [facility] patient survey, I would appreciate you taking the time to let us know about your experience today. Thank you! ? ? ?

## 2022-08-10 LAB — CYTOLOGY - PAP
Chlamydia: NEGATIVE
Comment: NEGATIVE
Comment: NORMAL
Diagnosis: NEGATIVE
Diagnosis: REACTIVE
Neisseria Gonorrhea: NEGATIVE

## 2022-10-28 ENCOUNTER — Inpatient Hospital Stay: Payer: No Typology Code available for payment source

## 2022-10-28 ENCOUNTER — Inpatient Hospital Stay: Payer: No Typology Code available for payment source | Admitting: Oncology

## 2022-10-28 ENCOUNTER — Other Ambulatory Visit: Payer: Self-pay | Admitting: *Deleted

## 2022-10-28 DIAGNOSIS — D708 Other neutropenia: Secondary | ICD-10-CM

## 2023-08-28 ENCOUNTER — Encounter: Payer: Self-pay | Admitting: Obstetrics and Gynecology

## 2023-09-14 ENCOUNTER — Ambulatory Visit: Payer: Self-pay | Admitting: Obstetrics and Gynecology

## 2023-09-23 ENCOUNTER — Other Ambulatory Visit: Payer: Self-pay | Admitting: Obstetrics and Gynecology

## 2023-09-23 DIAGNOSIS — Z3041 Encounter for surveillance of contraceptive pills: Secondary | ICD-10-CM

## 2023-10-12 DIAGNOSIS — F411 Generalized anxiety disorder: Secondary | ICD-10-CM | POA: Diagnosis not present

## 2023-10-19 ENCOUNTER — Encounter: Payer: Self-pay | Admitting: Obstetrics and Gynecology

## 2023-10-19 DIAGNOSIS — L02224 Furuncle of groin: Secondary | ICD-10-CM | POA: Diagnosis not present

## 2023-10-19 DIAGNOSIS — N898 Other specified noninflammatory disorders of vagina: Secondary | ICD-10-CM | POA: Diagnosis not present

## 2023-10-19 DIAGNOSIS — L049 Acute lymphadenitis, unspecified: Secondary | ICD-10-CM | POA: Diagnosis not present

## 2023-10-19 DIAGNOSIS — Z113 Encounter for screening for infections with a predominantly sexual mode of transmission: Secondary | ICD-10-CM | POA: Diagnosis not present

## 2023-10-26 DIAGNOSIS — F411 Generalized anxiety disorder: Secondary | ICD-10-CM | POA: Diagnosis not present

## 2023-10-30 ENCOUNTER — Ambulatory Visit: Payer: Self-pay | Admitting: Obstetrics and Gynecology

## 2023-11-16 NOTE — Progress Notes (Signed)
 PCP:  Patient, No Pcp Per   Chief Complaint  Patient presents with   Gynecologic Exam    Lymph node in groin area grew in size about a month ago, not bothering now.     HPI:      [name] is a 28 y.o. whose LMP was Patient's last menstrual period was 10/26/2023 (exact date)., presents today for her annual examination.  Her menses are regular every 28-30 days, lasting 4-5 days, mod flow off OCPs with nausea.  Dysmenorrhea minimal, improved with tylenol. She does not have intermenstrual bleeding.  Sex activity: single partner, contraception - none/abstinence. Was on OCPs (estrogen/progesterone) but stopped when Rx ran out 2/25. Wanted to see what she felt like off OCPs. Doesn't like daily method either. Doesn't want to conceive. No pain/bleeding.  Last Pap: 08/04/22 Results were normal Hx of STDs: none; neg STD testing at urgent care 2/25  There is no FH of breast cancer. There is no FH of ovarian cancer. The patient does occas do self-breast exams.  Tobacco use: The patient denies current or previous tobacco use. Alcohol use: social drinker No drug use.  Exercise: moderately active  She does get adequate calcium and sometimes Vitamin D in her diet.  Gardasil declined.   Went to urgent care last month due to lump LT groin area, minimal pain, no d/c. Sx started after waxing 12/24; pt thought ingrown hair initially but didn't look like one. Diagnosed with abscess and treated with bactrim for 10 days. Sx are smaller now, still no pain. Had neg STD testing. Culture showed BV so pt treated with flagyl; was having increased d/c only, no odor. No real change in sx.   Past Medical History:  Diagnosis Date   BV (bacterial vaginosis)    Healthy adult on routine physical examination 12/26/2019   Neck swelling 03/06/2020   Neutropenia (HCC) 03/06/2020    Past Surgical History:  Procedure Laterality Date   TONSILLECTOMY     TOOTH EXTRACTION      Family History  Problem Relation  Age of Onset   Diabetes Mother    Vitiligo Father    Multiple myeloma Paternal Grandmother     Social History   Socioeconomic History   Marital status: Single    Spouse name: Not on file   Number of children: Not on file   Years of education: Not on file   Highest education level: Not on file  Occupational History   Not on file  Tobacco Use   Smoking status: Never   Smokeless tobacco: Never  Vaping Use   Vaping status: Never Used  Substance and Sexual Activity   Alcohol use: Yes    Comment: On occasion   Drug use: Never   Sexual activity: Yes    Birth control/protection: None  Other Topics Concern   Not on file  Social History Narrative   Not on file   Social Drivers of Health   Financial Resource Strain: Not on file  Food Insecurity: Not on file  Transportation Needs: Not on file  Physical Activity: Not on file  Stress: Not on file  Social Connections: Unknown (01/11/2022)   Received from [facility], [facility]   Social Network    Social Network: Not on file  Intimate Partner Violence: Unknown (12/03/2021)   Received from [facility], [facility]   HITS    Physically Hurt: Not on file    Insult or Talk Down To: Not on file  Threaten Physical Harm: Not on file    Scream or Curse: Not on file    Outpatient Medications Prior to Visit  Medication Sig Dispense Refill   Cholecalciferol (VITAMIN D3) 1.25 MG (50000 UT) CAPS Take by mouth.     Cyanocobalamin (B-12) 100 MCG TABS Take by mouth.     Multiple Vitamins-Minerals (MULTIPLE VITAMINS/WOMENS PO) Take by mouth.     levonorgestrel-ethinyl estradiol (AVIANE) 0.1-20 MG-MCG tablet Take 1 tablet by mouth daily. 84 tablet 3   No facility-administered medications prior to visit.      ROS:  Review of Systems  Constitutional:  Negative for fatigue, fever and unexpected weight change.  Respiratory:  Negative for cough, shortness of breath and wheezing.   Cardiovascular:  Negative for chest pain,  palpitations and leg swelling.  Gastrointestinal:  Negative for blood in stool, constipation, diarrhea, nausea and vomiting.  Endocrine: Negative for cold intolerance, heat intolerance and polyuria.  Genitourinary:  Negative for dyspareunia, dysuria, flank pain, frequency, genital sores, hematuria, menstrual problem, pelvic pain, urgency, vaginal bleeding, vaginal discharge and vaginal pain.  Musculoskeletal:  Negative for back pain, joint swelling and myalgias.  Skin:  Negative for rash.  Neurological:  Negative for dizziness, syncope, light-headedness, numbness and headaches.  Hematological:  Negative for adenopathy.  Psychiatric/Behavioral:  Positive for agitation. Negative for confusion, sleep disturbance and suicidal ideas. The patient is not nervous/anxious.   BREAST: No symptoms   Objective: BP 90/64   Ht 5\' 1"  (1.549 m)   Wt 141 lb (64 kg)   LMP 10/26/2023 (Exact Date)   BMI 26.64 kg/m    Physical Exam Constitutional:      Appearance: She is well-developed.  Genitourinary:     Vulva normal.     Right Labia: No rash, tenderness or lesions.    Left Labia: No tenderness, lesions or rash.       No vaginal discharge, erythema or tenderness.      Right Adnexa: not tender and no mass present.    Left Adnexa: not tender and no mass present.    No cervical motion tenderness, friability or polyp.     Uterus is not enlarged or tender.  Breasts:    Right: No mass, nipple discharge, skin change or tenderness.     Left: No mass, nipple discharge, skin change or tenderness.  Neck:     Thyroid: No thyromegaly.  Cardiovascular:     Rate and Rhythm: Normal rate and regular rhythm.     Heart sounds: Normal heart sounds. No murmur heard. Pulmonary:     Effort: Pulmonary effort is normal.     Breath sounds: Normal breath sounds.  Abdominal:     Palpations: Abdomen is soft.     Tenderness: There is no abdominal tenderness. There is no guarding or rebound.  Musculoskeletal:         General: Normal range of motion.     Cervical back: Normal range of motion.  Lymphadenopathy:     Cervical: No cervical adenopathy.  Neurological:     General: No focal deficit present.     Mental Status: She is alert and oriented to person, place, and time.     Cranial Nerves: No cranial nerve deficit.  Skin:    General: Skin is warm and dry.  Psychiatric:        Mood and Affect: Mood normal.        Behavior: Behavior normal.        Thought Content: Thought content normal.  Judgment: Judgment normal.  Vitals reviewed.     Assessment/Plan: Encounter for annual routine gynecological examination  Encounter for initial prescription of transdermal patch hormonal contraceptive device - Plan: norelgestromin-ethinyl estradiol [employer]ico) 150-35 MCG/24HR transdermal patch; BC options discussed. Pt willing to try xulane, Rx eRxd, start with menses, condoms for 1 wk. F/u prn.   Groin abscess--resolving, reassurance. Still feels hard due to inflammation. Warm compresses, f/u prn.    Meds ordered this encounter  Medications   norelgestromin-ethinyl estradiol [employer]ico) 150-35 MCG/24HR transdermal patch    Sig: Place 1 patch onto the skin once a week. Apply 1 patch weekly for 3 weeks, then 1 week without patch    Dispense:  9 patch    Refill:  3    Supervising Provider:   Waymon Budge             GYN counsel adequate intake of calcium and vitamin D, diet and exercise, Gardasil     F/U  Return in about 1 year (around 11/19/2024).  Alexza Norbeck B. Aylla Huffine, PA-C 11/20/2023 5:20 PM

## 2023-11-20 ENCOUNTER — Ambulatory Visit (INDEPENDENT_AMBULATORY_CARE_PROVIDER_SITE_OTHER): Payer: Self-pay | Admitting: Obstetrics and Gynecology

## 2023-11-20 ENCOUNTER — Encounter: Payer: Self-pay | Admitting: Obstetrics and Gynecology

## 2023-11-20 VITALS — BP 90/64 | Ht 61.0 in | Wt 141.0 lb

## 2023-11-20 DIAGNOSIS — L02214 Cutaneous abscess of groin: Secondary | ICD-10-CM

## 2023-11-20 DIAGNOSIS — Z3041 Encounter for surveillance of contraceptive pills: Secondary | ICD-10-CM

## 2023-11-20 DIAGNOSIS — Z30016 Encounter for initial prescription of transdermal patch hormonal contraceptive device: Secondary | ICD-10-CM

## 2023-11-20 DIAGNOSIS — Z113 Encounter for screening for infections with a predominantly sexual mode of transmission: Secondary | ICD-10-CM

## 2023-11-20 DIAGNOSIS — Z01419 Encounter for gynecological examination (general) (routine) without abnormal findings: Secondary | ICD-10-CM | POA: Diagnosis not present

## 2023-11-20 MED ORDER — NORELGESTROMIN-ETH ESTRADIOL 150-35 MCG/24HR TD PTWK: 1.0000 | MEDICATED_PATCH | TRANSDERMAL | 3 refills | Status: AC

## 2023-11-20 NOTE — Patient Instructions (Signed)
 I value your feedback and you entrusting [place] with your care. If you get a [place] patient survey, I would appreciate you taking the time to let us know about your experience today. Thank you! ? ? ?

## 2023-12-14 ENCOUNTER — Encounter: Payer: Self-pay | Admitting: Obstetrics and Gynecology

## 2024-01-11 DIAGNOSIS — F411 Generalized anxiety disorder: Secondary | ICD-10-CM | POA: Diagnosis not present
